# Patient Record
Sex: Male | Born: 1965
Health system: Southern US, Community
[De-identification: ages and names within clinical notes are randomized; demographics above are authoritative.]

## PROBLEM LIST (undated history)

## (undated) DIAGNOSIS — L732 Hidradenitis suppurativa: Secondary | ICD-10-CM

## (undated) DIAGNOSIS — H5440 Blindness, one eye, unspecified eye: Secondary | ICD-10-CM

## (undated) DIAGNOSIS — F1910 Other psychoactive substance abuse, uncomplicated: Secondary | ICD-10-CM

## (undated) HISTORY — DX: Blindness, one eye, unspecified eye: H54.40

## (undated) HISTORY — DX: Hidradenitis suppurativa: L73.2

## (undated) HISTORY — PX: BACK SURGERY: SHX140

## (undated) HISTORY — DX: Other psychoactive substance abuse, uncomplicated: F19.10

---

## 1999-12-24 ENCOUNTER — Emergency Department (HOSPITAL_COMMUNITY): Admission: EM | Admit: 1999-12-24 | Discharge: 1999-12-24 | Payer: Self-pay | Admitting: Emergency Medicine

## 1999-12-24 ENCOUNTER — Encounter: Payer: Self-pay | Admitting: Emergency Medicine

## 2000-06-27 ENCOUNTER — Emergency Department (HOSPITAL_COMMUNITY): Admission: EM | Admit: 2000-06-27 | Discharge: 2000-06-27 | Payer: Self-pay | Admitting: *Deleted

## 2000-06-27 ENCOUNTER — Encounter: Payer: Self-pay | Admitting: Emergency Medicine

## 2001-09-14 ENCOUNTER — Emergency Department (HOSPITAL_COMMUNITY): Admission: EM | Admit: 2001-09-14 | Discharge: 2001-09-14 | Payer: Self-pay | Admitting: Emergency Medicine

## 2001-12-11 ENCOUNTER — Emergency Department (HOSPITAL_COMMUNITY): Admission: EM | Admit: 2001-12-11 | Discharge: 2001-12-11 | Payer: Self-pay | Admitting: Emergency Medicine

## 2003-03-31 ENCOUNTER — Emergency Department (HOSPITAL_COMMUNITY): Admission: EM | Admit: 2003-03-31 | Discharge: 2003-03-31 | Payer: Self-pay | Admitting: Emergency Medicine

## 2003-07-15 ENCOUNTER — Emergency Department (HOSPITAL_COMMUNITY): Admission: EM | Admit: 2003-07-15 | Discharge: 2003-07-15 | Payer: Self-pay

## 2004-09-23 ENCOUNTER — Emergency Department (HOSPITAL_COMMUNITY): Admission: EM | Admit: 2004-09-23 | Discharge: 2004-09-23 | Payer: Self-pay | Admitting: Emergency Medicine

## 2004-10-22 ENCOUNTER — Encounter: Admission: RE | Admit: 2004-10-22 | Discharge: 2005-01-20 | Payer: Self-pay | Admitting: Occupational Medicine

## 2005-02-25 ENCOUNTER — Ambulatory Visit (HOSPITAL_COMMUNITY): Admission: RE | Admit: 2005-02-25 | Discharge: 2005-02-26 | Payer: Self-pay | Admitting: Neurosurgery

## 2005-02-25 DIAGNOSIS — M5126 Other intervertebral disc displacement, lumbar region: Secondary | ICD-10-CM

## 2005-04-26 ENCOUNTER — Encounter: Admission: RE | Admit: 2005-04-26 | Discharge: 2005-07-25 | Payer: Self-pay | Admitting: Neurosurgery

## 2005-12-20 ENCOUNTER — Ambulatory Visit: Payer: Self-pay | Admitting: Internal Medicine

## 2005-12-20 DIAGNOSIS — IMO0002 Reserved for concepts with insufficient information to code with codable children: Secondary | ICD-10-CM | POA: Insufficient documentation

## 2006-01-04 ENCOUNTER — Ambulatory Visit: Payer: Self-pay | Admitting: Internal Medicine

## 2006-01-04 ENCOUNTER — Ambulatory Visit: Payer: Self-pay | Admitting: *Deleted

## 2006-12-07 ENCOUNTER — Encounter (INDEPENDENT_AMBULATORY_CARE_PROVIDER_SITE_OTHER): Payer: Self-pay | Admitting: *Deleted

## 2007-02-23 ENCOUNTER — Emergency Department (HOSPITAL_COMMUNITY): Admission: EM | Admit: 2007-02-23 | Discharge: 2007-02-23 | Payer: Self-pay | Admitting: Emergency Medicine

## 2007-05-04 ENCOUNTER — Ambulatory Visit: Payer: Self-pay | Admitting: Internal Medicine

## 2007-05-15 ENCOUNTER — Telehealth (INDEPENDENT_AMBULATORY_CARE_PROVIDER_SITE_OTHER): Payer: Self-pay | Admitting: Internal Medicine

## 2007-05-31 ENCOUNTER — Telehealth (INDEPENDENT_AMBULATORY_CARE_PROVIDER_SITE_OTHER): Payer: Self-pay | Admitting: Internal Medicine

## 2007-06-19 ENCOUNTER — Encounter (INDEPENDENT_AMBULATORY_CARE_PROVIDER_SITE_OTHER): Payer: Self-pay | Admitting: Internal Medicine

## 2007-06-20 ENCOUNTER — Ambulatory Visit: Payer: Self-pay | Admitting: Internal Medicine

## 2007-06-22 ENCOUNTER — Encounter (INDEPENDENT_AMBULATORY_CARE_PROVIDER_SITE_OTHER): Payer: Self-pay | Admitting: Internal Medicine

## 2007-07-17 ENCOUNTER — Telehealth (INDEPENDENT_AMBULATORY_CARE_PROVIDER_SITE_OTHER): Payer: Self-pay | Admitting: Internal Medicine

## 2007-08-18 ENCOUNTER — Encounter (INDEPENDENT_AMBULATORY_CARE_PROVIDER_SITE_OTHER): Payer: Self-pay | Admitting: Internal Medicine

## 2007-08-21 ENCOUNTER — Telehealth (INDEPENDENT_AMBULATORY_CARE_PROVIDER_SITE_OTHER): Payer: Self-pay | Admitting: Internal Medicine

## 2007-09-12 ENCOUNTER — Encounter (INDEPENDENT_AMBULATORY_CARE_PROVIDER_SITE_OTHER): Payer: Self-pay | Admitting: Internal Medicine

## 2007-09-21 ENCOUNTER — Ambulatory Visit: Payer: Self-pay | Admitting: Internal Medicine

## 2007-09-21 DIAGNOSIS — G4700 Insomnia, unspecified: Secondary | ICD-10-CM

## 2007-09-21 DIAGNOSIS — F3289 Other specified depressive episodes: Secondary | ICD-10-CM | POA: Insufficient documentation

## 2007-09-21 DIAGNOSIS — F329 Major depressive disorder, single episode, unspecified: Secondary | ICD-10-CM

## 2007-10-20 ENCOUNTER — Telehealth (INDEPENDENT_AMBULATORY_CARE_PROVIDER_SITE_OTHER): Payer: Self-pay | Admitting: Internal Medicine

## 2007-11-23 ENCOUNTER — Telehealth (INDEPENDENT_AMBULATORY_CARE_PROVIDER_SITE_OTHER): Payer: Self-pay | Admitting: Internal Medicine

## 2007-12-01 ENCOUNTER — Ambulatory Visit: Payer: Self-pay | Admitting: Internal Medicine

## 2007-12-21 ENCOUNTER — Encounter (INDEPENDENT_AMBULATORY_CARE_PROVIDER_SITE_OTHER): Payer: Self-pay | Admitting: Internal Medicine

## 2007-12-21 ENCOUNTER — Telehealth (INDEPENDENT_AMBULATORY_CARE_PROVIDER_SITE_OTHER): Payer: Self-pay | Admitting: Internal Medicine

## 2007-12-21 ENCOUNTER — Encounter (INDEPENDENT_AMBULATORY_CARE_PROVIDER_SITE_OTHER): Payer: Self-pay | Admitting: Nurse Practitioner

## 2008-01-18 ENCOUNTER — Ambulatory Visit: Payer: Self-pay | Admitting: Internal Medicine

## 2008-01-18 DIAGNOSIS — K089 Disorder of teeth and supporting structures, unspecified: Secondary | ICD-10-CM | POA: Insufficient documentation

## 2008-02-19 ENCOUNTER — Telehealth (INDEPENDENT_AMBULATORY_CARE_PROVIDER_SITE_OTHER): Payer: Self-pay | Admitting: Internal Medicine

## 2008-03-20 ENCOUNTER — Telehealth (INDEPENDENT_AMBULATORY_CARE_PROVIDER_SITE_OTHER): Payer: Self-pay | Admitting: Internal Medicine

## 2008-04-02 ENCOUNTER — Ambulatory Visit: Payer: Self-pay | Admitting: Internal Medicine

## 2008-04-02 ENCOUNTER — Telehealth (INDEPENDENT_AMBULATORY_CARE_PROVIDER_SITE_OTHER): Payer: Self-pay | Admitting: Internal Medicine

## 2008-04-05 ENCOUNTER — Encounter (INDEPENDENT_AMBULATORY_CARE_PROVIDER_SITE_OTHER): Payer: Self-pay | Admitting: Internal Medicine

## 2008-04-09 ENCOUNTER — Telehealth (INDEPENDENT_AMBULATORY_CARE_PROVIDER_SITE_OTHER): Payer: Self-pay | Admitting: Internal Medicine

## 2008-04-16 ENCOUNTER — Telehealth (INDEPENDENT_AMBULATORY_CARE_PROVIDER_SITE_OTHER): Payer: Self-pay | Admitting: *Deleted

## 2008-05-02 ENCOUNTER — Telehealth (INDEPENDENT_AMBULATORY_CARE_PROVIDER_SITE_OTHER): Payer: Self-pay | Admitting: *Deleted

## 2008-05-20 ENCOUNTER — Telehealth (INDEPENDENT_AMBULATORY_CARE_PROVIDER_SITE_OTHER): Payer: Self-pay | Admitting: Internal Medicine

## 2008-05-29 ENCOUNTER — Telehealth (INDEPENDENT_AMBULATORY_CARE_PROVIDER_SITE_OTHER): Payer: Self-pay | Admitting: Internal Medicine

## 2008-06-19 ENCOUNTER — Telehealth (INDEPENDENT_AMBULATORY_CARE_PROVIDER_SITE_OTHER): Payer: Self-pay | Admitting: Internal Medicine

## 2008-07-01 ENCOUNTER — Telehealth (INDEPENDENT_AMBULATORY_CARE_PROVIDER_SITE_OTHER): Payer: Self-pay | Admitting: *Deleted

## 2008-07-19 ENCOUNTER — Telehealth (INDEPENDENT_AMBULATORY_CARE_PROVIDER_SITE_OTHER): Payer: Self-pay | Admitting: Internal Medicine

## 2008-07-30 ENCOUNTER — Telehealth (INDEPENDENT_AMBULATORY_CARE_PROVIDER_SITE_OTHER): Payer: Self-pay | Admitting: Internal Medicine

## 2008-08-08 ENCOUNTER — Ambulatory Visit: Payer: Self-pay | Admitting: Internal Medicine

## 2008-08-20 ENCOUNTER — Telehealth (INDEPENDENT_AMBULATORY_CARE_PROVIDER_SITE_OTHER): Payer: Self-pay | Admitting: Internal Medicine

## 2008-08-30 ENCOUNTER — Telehealth (INDEPENDENT_AMBULATORY_CARE_PROVIDER_SITE_OTHER): Payer: Self-pay | Admitting: Internal Medicine

## 2008-09-18 ENCOUNTER — Telehealth (INDEPENDENT_AMBULATORY_CARE_PROVIDER_SITE_OTHER): Payer: Self-pay | Admitting: *Deleted

## 2008-09-30 ENCOUNTER — Telehealth (INDEPENDENT_AMBULATORY_CARE_PROVIDER_SITE_OTHER): Payer: Self-pay | Admitting: *Deleted

## 2008-10-22 ENCOUNTER — Ambulatory Visit: Payer: Self-pay | Admitting: Internal Medicine

## 2008-10-31 ENCOUNTER — Telehealth (INDEPENDENT_AMBULATORY_CARE_PROVIDER_SITE_OTHER): Payer: Self-pay | Admitting: Internal Medicine

## 2008-11-21 ENCOUNTER — Telehealth (INDEPENDENT_AMBULATORY_CARE_PROVIDER_SITE_OTHER): Payer: Self-pay | Admitting: Internal Medicine

## 2008-12-02 ENCOUNTER — Telehealth (INDEPENDENT_AMBULATORY_CARE_PROVIDER_SITE_OTHER): Payer: Self-pay | Admitting: Internal Medicine

## 2008-12-24 ENCOUNTER — Ambulatory Visit: Payer: Self-pay | Admitting: Internal Medicine

## 2008-12-31 ENCOUNTER — Telehealth (INDEPENDENT_AMBULATORY_CARE_PROVIDER_SITE_OTHER): Payer: Self-pay | Admitting: Internal Medicine

## 2009-01-20 ENCOUNTER — Telehealth (INDEPENDENT_AMBULATORY_CARE_PROVIDER_SITE_OTHER): Payer: Self-pay | Admitting: *Deleted

## 2009-01-30 ENCOUNTER — Telehealth (INDEPENDENT_AMBULATORY_CARE_PROVIDER_SITE_OTHER): Payer: Self-pay | Admitting: Internal Medicine

## 2009-02-18 ENCOUNTER — Telehealth (INDEPENDENT_AMBULATORY_CARE_PROVIDER_SITE_OTHER): Payer: Self-pay | Admitting: Internal Medicine

## 2009-03-03 ENCOUNTER — Telehealth (INDEPENDENT_AMBULATORY_CARE_PROVIDER_SITE_OTHER): Payer: Self-pay | Admitting: *Deleted

## 2009-03-24 ENCOUNTER — Telehealth (INDEPENDENT_AMBULATORY_CARE_PROVIDER_SITE_OTHER): Payer: Self-pay | Admitting: Internal Medicine

## 2009-04-02 ENCOUNTER — Telehealth (INDEPENDENT_AMBULATORY_CARE_PROVIDER_SITE_OTHER): Payer: Self-pay | Admitting: Internal Medicine

## 2009-04-23 ENCOUNTER — Telehealth (INDEPENDENT_AMBULATORY_CARE_PROVIDER_SITE_OTHER): Payer: Self-pay | Admitting: Internal Medicine

## 2009-05-02 ENCOUNTER — Telehealth (INDEPENDENT_AMBULATORY_CARE_PROVIDER_SITE_OTHER): Payer: Self-pay | Admitting: Internal Medicine

## 2009-05-19 ENCOUNTER — Telehealth (INDEPENDENT_AMBULATORY_CARE_PROVIDER_SITE_OTHER): Payer: Self-pay | Admitting: Internal Medicine

## 2009-06-02 ENCOUNTER — Telehealth (INDEPENDENT_AMBULATORY_CARE_PROVIDER_SITE_OTHER): Payer: Self-pay | Admitting: Internal Medicine

## 2009-06-20 ENCOUNTER — Telehealth (INDEPENDENT_AMBULATORY_CARE_PROVIDER_SITE_OTHER): Payer: Self-pay | Admitting: Internal Medicine

## 2009-07-07 ENCOUNTER — Telehealth (INDEPENDENT_AMBULATORY_CARE_PROVIDER_SITE_OTHER): Payer: Self-pay | Admitting: Internal Medicine

## 2009-07-21 ENCOUNTER — Telehealth (INDEPENDENT_AMBULATORY_CARE_PROVIDER_SITE_OTHER): Payer: Self-pay | Admitting: Internal Medicine

## 2009-07-30 ENCOUNTER — Telehealth (INDEPENDENT_AMBULATORY_CARE_PROVIDER_SITE_OTHER): Payer: Self-pay | Admitting: Internal Medicine

## 2009-08-25 ENCOUNTER — Telehealth (INDEPENDENT_AMBULATORY_CARE_PROVIDER_SITE_OTHER): Payer: Self-pay | Admitting: Internal Medicine

## 2009-09-15 ENCOUNTER — Telehealth (INDEPENDENT_AMBULATORY_CARE_PROVIDER_SITE_OTHER): Payer: Self-pay | Admitting: Internal Medicine

## 2009-09-23 ENCOUNTER — Telehealth (INDEPENDENT_AMBULATORY_CARE_PROVIDER_SITE_OTHER): Payer: Self-pay | Admitting: Internal Medicine

## 2009-10-07 ENCOUNTER — Telehealth (INDEPENDENT_AMBULATORY_CARE_PROVIDER_SITE_OTHER): Payer: Self-pay | Admitting: Internal Medicine

## 2009-11-04 ENCOUNTER — Telehealth (INDEPENDENT_AMBULATORY_CARE_PROVIDER_SITE_OTHER): Payer: Self-pay | Admitting: *Deleted

## 2009-11-07 ENCOUNTER — Encounter (INDEPENDENT_AMBULATORY_CARE_PROVIDER_SITE_OTHER): Payer: Self-pay | Admitting: Internal Medicine

## 2010-04-21 NOTE — Progress Notes (Signed)
Summary: CALLING FOR PERCOCET SCRIPT   Phone Note Call from Patient Call back at Home Phone [phone]   Reason for Call: Refill Medication Summary of Call: [name] PT. MR [name] FOR HIS PERCOCET RX. AND WANTS TO KNOW IF HE CAN PICK IT UP ON THIS FRIDAY WHEN HE COMES IN FOR HIS BLOOD WORK. Initial call taken by: Leodis Rains,  April 23, 2009 11:44 AM  Follow-up for Phone Call        Last got #90 on 03/26/09 Follow-up by: Vesta Mixer CMA,  April 23, 2009 12:29 PM  Additional Follow-up for Phone Call Additional follow up Details #1::        Rx up front for pt to pick up tomorrow. Additional Follow-up by: Vesta Mixer CMA,  April 24, 2009 10:24 AM    Prescriptions: PERCOCET 5-325 MG  TABS (OXYCODONE-ACETAMINOPHEN) 1-2 tabs by mouth q4-6 h as needed pain  #90 x 0   Entered and Authorized by:   Julieanne Manson MD   Signed by:   Julieanne Manson MD on 04/23/2009   Method used:   Print then Give to Patient   RxID:   [id number]  May pick up on Friday

## 2010-04-21 NOTE — Progress Notes (Signed)
Summary: Refills   Phone Note Call from Patient Call back at [facility] Phone [phone]   Summary of Call: The pt needs refills from percocet which due on Monday June 23, 2009. [facility] MD Initial call taken by: Manon Hilding,  June 20, 2009 10:53 AM  Follow-up for Phone Call        Last got #90 on 05/22/09. Follow-up by: Vesta Mixer CMA,  June 20, 2009 11:18 AM  Additional Follow-up for Phone Call Additional follow up Details #1::        PLEASE GIVE HIM A CALL WHEN IT'S READY Additional Follow-up by: Arta Bruce,  June 23, 2009 8:39 AM    Additional Follow-up for Phone Call Additional follow up Details #2::    Call--can pick up tomorrow morning. Follow-up by: Julieanne Manson MD,  June 23, 2009 9:47 AM  Prescriptions: PERCOCET 5-325 MG  TABS (OXYCODONE-ACETAMINOPHEN) 1-2 tabs by mouth q4-6 h as needed pain  #90 x 0   Entered and Authorized by:   Julieanne Manson MD   Signed by:   Julieanne Manson MD on 06/23/2009   Method used:   Print then Give to Patient   RxID:   [id number]

## 2010-04-21 NOTE — Progress Notes (Signed)
Summary: morphene refills   Phone Note Call from Patient Call back at [facility] Phone [phone]   Summary of Call: The pt needs more refills from morphine. Pt could aford to pay the whole prescription instead of getting 60 pills he only got 48 pills.  Please call him back when is ready. Initial call taken by: Manon Hilding,  May 02, 2009 10:37 AM  Follow-up for Phone Call        By my calulations pt should need his refill on 05/05/09.  Last got on 04/11/09 and could not afford 6 days worth and January had 31 days. Follow-up by: Vesta Mixer CMA,  May 02, 2009 11:40 AM  Additional Follow-up for Phone Call Additional follow up Details #1::        May pick up on Monday Additional Follow-up by: Julieanne Manson MD,  May 02, 2009 5:33 PM    Additional Follow-up for Phone Call Additional follow up Details #2::    Pt picked up rx. Follow-up by: Vesta Mixer CMA,  May 05, 2009 9:29 AM  Prescriptions: MS CONTIN 30 MG XR12H-TAB (MORPHINE SULFATE) 1 tab by mouth q 12 hours  #60 x 0   Entered and Authorized by:   Julieanne Manson MD   Signed by:   Julieanne Manson MD on 05/02/2009   Method used:   Print then Give to Patient   RxID:   [id number]

## 2010-04-21 NOTE — Progress Notes (Signed)
Summary: Possible Discharge   Phone Note Outgoing Call   Summary of Call: Dr Delrae Alfred, This is patients 4th no-show. Pt has be counseled on no-shows in the past by staff member. Please review chart prior to discharge from practice for health stability. Initial call taken by: Hassell Halim CMA,  November 04, 2009 3:36 PM  Follow-up for Phone Call        Send letter. Follow-up by: Julieanne Manson MD,  November 05, 2009 9:49 PM  Additional Follow-up for Phone Call Additional follow up Details #1::        Chart reviewed by provider ok to send d/c letter. Additional Follow-up by: Hassell Halim CMA,  November 06, 2009 4:53 PM    Additional Follow-up for Phone Call Additional follow up Details #2::    MAILED AND SCANNED DISCHARGE LETTER 11/07/09 Follow-up by: Arta Bruce,  November 07, 2009 9:32 AM

## 2010-04-21 NOTE — Progress Notes (Signed)
Summary: PERCOCET DU TODAY   Phone Note Call from Patient Call back at Home Phone [phone]   Reason for Call: Refill Medication Summary of Call: [facility] PT. MR Hogue CALLED TODAY FOR HIS PERCOCET, WHICH IS DUE TODAY. Initial call taken by: Leodis Rains,  August 25, 2009 9:24 AM  Follow-up for Phone Call        Pt last got #90 on 07/23/09. Follow-up by: Vesta Mixer CMA,  August 25, 2009 11:11 AM  Additional Follow-up for Phone Call Additional follow up Details #1::        He needs to follow up with me--last Rx before he is seen--was supposed to be seen in Feb Additional Follow-up by: Julieanne Manson MD,  August 26, 2009 4:51 PM    Additional Follow-up for Phone Call Additional follow up Details #2::    Kim to schedule f/u Follow-up by: Vesta Mixer CMA,  August 26, 2009 5:00 PM  Prescriptions: PERCOCET 5-325 MG  TABS (OXYCODONE-ACETAMINOPHEN) 1-2 tabs by mouth q4-6 h as needed pain  #90 x 0   Entered and Authorized by:   Julieanne Manson MD   Signed by:   Julieanne Manson MD on 08/26/2009   Method used:   Print then Give to Patient   RxID:   [id number]

## 2010-04-21 NOTE — Progress Notes (Signed)
Summary: MORPHINE DUE   Phone Note Call from Patient Call back at Home Phone [phone]   Reason for Call: Refill Medication Summary of Call: Britini Garcilazo PT. MR Folkes IS CALLING FOR HIS MORPHINE TO BE REFILLED. Initial call taken by: Leodis Rains,  September 15, 2009 9:09 AM  Follow-up for Phone Call        Last got #60 on 5/18 Follow-up by: Vesta Mixer CMA,  September 15, 2009 9:33 AM  Additional Follow-up for Phone Call Additional follow up Details #1::        Rx printed pt must come to pick up Additional Follow-up by: Lehman Prom FNP,  September 15, 2009 4:38 PM    Additional Follow-up for Phone Call Additional follow up Details #2::    Pt aware. Follow-up by: Vesta Mixer CMA,  September 15, 2009 4:42 PM  Prescriptions: MS CONTIN 30 MG XR12H-TAB (MORPHINE SULFATE) 1 tab by mouth q 12 hours  #60 x 0   Entered and Authorized by:   Lehman Prom FNP   Signed by:   Lehman Prom FNP on 09/15/2009   Method used:   Print then Give to Patient   RxID:   [id number]

## 2010-04-21 NOTE — Progress Notes (Signed)
Summary: PERCOCET RX   Phone Note Call from Patient Call back at Home Phone [phone]   Reason for Call: Refill Medication Summary of Call: Marielouise Amey PT. MR Delduca IS CALLING FOR HIS PERCOCET. WHICH IS DUE TOMORROW. MR Cary SAYS THAT THE PRICE OF HIS MORPHINE MEDICATION  WENT UP TO $70 A BOTTLE ANDHE CAN'T AFFORD TO GET ALL 60 AND HE ONLY GOT 35 OF THEM, AND HE HAS BEEN TRYING TO MAKE THEM LAST, AND THATS WHY HE HAS RAN OUT OF THE PERCOCET. Initial call taken by: Leodis Rains,  March 24, 2009 12:30 PM  Follow-up for Phone Call        Last got #90 on 02/21/09. Follow-up by: Vesta Mixer CMA,  March 24, 2009 3:32 PM    Prescriptions: PERCOCET 5-325 MG  TABS (OXYCODONE-ACETAMINOPHEN) 1-2 tabs by mouth q4-6 h as needed pain  #90 x 0   Entered and Authorized by:   Julieanne Manson MD   Signed by:   Julieanne Manson MD on 03/26/2009   Method used:   Print then Give to Patient   RxID:   [id number]

## 2010-04-21 NOTE — Progress Notes (Signed)
Summary: morphine refill   Phone Note Call from Patient   Summary of Call: The pt needs refills from his morphene mdication and he needs to pick up by Friday. Mieko Kneebone MD Initial call taken by: Manon Hilding,  Jul 30, 2009 11:26 AM  Follow-up for Phone Call        Pt last got #60 on 07/07/09. Follow-up by: Vesta Mixer CMA,  Jul 30, 2009 11:37 AM  Additional Follow-up for Phone Call Additional follow up Details #1::        Not due until the 18th Additional Follow-up by: Julieanne Manson MD,  Aug 01, 2009 7:42 AM    Prescriptions: MS CONTIN 30 MG XR12H-TAB (MORPHINE SULFATE) 1 tab by mouth q 12 hours  #60 x 0   Entered and Authorized by:   Julieanne Manson MD   Signed by:   Julieanne Manson MD on 08/06/2009   Method used:   Print then Give to Patient   RxID:   [id number]

## 2010-04-21 NOTE — Progress Notes (Signed)
Summary: percocet refill Wed.   Phone Note Call from Patient   Summary of Call: The pt needs refills from his percocet medication that due on Wednesday. Cheray Pardi MD Initial call taken by: Manon Hilding,  Jul 21, 2009 11:06 AM  Follow-up for Phone Call        Last got #90 on 06/23/09. Follow-up by: Vesta Mixer CMA,  Jul 21, 2009 11:44 AM  Additional Follow-up for Phone Call Additional follow up Details #1::        May pick up. Additional Follow-up by: Julieanne Manson MD,  Jul 23, 2009 8:20 AM    Prescriptions: PERCOCET 5-325 MG  TABS (OXYCODONE-ACETAMINOPHEN) 1-2 tabs by mouth q4-6 h as needed pain  #90 x 0   Entered and Authorized by:   Julieanne Manson MD   Signed by:   Julieanne Manson MD on 07/23/2009   Method used:   Print then Give to Patient   RxID:   [id number]

## 2010-04-21 NOTE — Progress Notes (Signed)
Summary: REQUEST FOR HIS MORPHENE RX   Phone Note Call from Patient Call back at Home Phone [phone]   Reason for Call: Refill Medication Summary of Call: James Wright PT. James Wright IS CALLING IN FOR HIS [facility] RX. Initial call taken by: Leodis Rains,  June 02, 2009 10:01 AM  Follow-up for Phone Call        Last got #60 on 05/02/09. Follow-up by: Vesta Mixer CMA,  June 02, 2009 11:07 AM  Additional Follow-up for Phone Call Additional follow up Details #1::        HAS A LAB VISIT 06/18/09 AND PE5 AON 06/20/09 HAS APPLIED FOR MEDICAID ,,WAITING FOR REAPPLY Additional Follow-up by: Arta Bruce,  June 05, 2009 9:20 AM    Prescriptions: MS CONTIN 30 MG XR12H-TAB (MORPHINE SULFATE) 1 tab by mouth q 12 hours  #60 x 0   Entered and Authorized by:   Julieanne Manson MD   Signed by:   Julieanne Manson MD on 06/06/2009   Method used:   Print then Give to Patient   RxID:   [id number] MS CONTIN 30 MG XR12H-TAB (MORPHINE SULFATE) 1 tab by mouth q 12 hours  #60 x 0   Entered and Authorized by:   Julieanne Manson MD   Signed by:   Julieanne Manson MD on 06/04/2009   Method used:   Print then Give to Patient   RxID:   [id number]  Pt. needs an OV in next 2 months--does he have Medicaid yet--please check and let me know. Did not print the 1st time--reprinted

## 2010-04-21 NOTE — Progress Notes (Signed)
Summary: PERCOCET DUE FRIDAY   Phone Note Call from Patient Call back at Home Phone [phone]   Reason for Call: Refill Medication Summary of Call: Espn Zeman PT. MR Celia IS CALLING AHEAD OF TIME FOR HIS PERCOCET THAT IS DUE ON FRIDAY THE 4th Initial call taken by: Leodis Rains,  May 19, 2009 9:08 AM  Follow-up for Phone Call        Last got #90 on 04/23/09. Follow-up by: Vesta Mixer CMA,  May 19, 2009 10:23 AM    Prescriptions: PERCOCET 5-325 MG  TABS (OXYCODONE-ACETAMINOPHEN) 1-2 tabs by mouth q4-6 h as needed pain  #90 x 0   Entered and Authorized by:   Julieanne Manson MD   Signed by:   Julieanne Manson MD on 05/22/2009   Method used:   Print then Give to Patient   RxID:   [id number]  May pick up on Friday

## 2010-04-21 NOTE — Letter (Signed)
Summary: Letter/DISCHARGE LETTER MAILED  Letter/DISCHARGE LETTER MAILED   Imported By: Arta Bruce 11/07/2009 09:27:41  _____________________________________________________________________  External Attachment:    Type:   Image     Comment:   External Document

## 2010-04-21 NOTE — Progress Notes (Signed)
Summary: narcotic refill request   Phone Note Call from Patient Call back at Home Phone [phone]   Summary of Call: pt calling stating that his refill on morphine is due today.   Initial call taken by: Mikey College CMA,  July 07, 2009 8:43 AM  Follow-up for Phone Call        Rx printed and at nurse station notify pt to come pick up Follow-up by: Lehman Prom FNP,  July 07, 2009 5:54 PM  Additional Follow-up for Phone Call Additional follow up Details #1::        Pt picked up rx. Additional Follow-up by: Vesta Mixer CMA,  July 08, 2009 9:51 AM    Prescriptions: MS CONTIN 30 MG XR12H-TAB (MORPHINE SULFATE) 1 tab by mouth q 12 hours  #60 x 0   Entered and Authorized by:   Lehman Prom FNP   Signed by:   Lehman Prom FNP on 07/07/2009   Method used:   Print then Give to Patient   RxID:   [id number] MS CONTIN 30 MG XR12H-TAB (MORPHINE SULFATE) 1 tab by mouth q 12 hours  #60 x 0   Entered and Authorized by:   Lehman Prom FNP   Signed by:   Lehman Prom FNP on 07/07/2009   Method used:   Print then Give to Patient   RxID:   [id number]

## 2010-04-21 NOTE — Progress Notes (Signed)
Summary: CALING FOR MORPHINE   Phone Note Call from Patient Call back at Home Phone [phone]   Reason for Call: Refill Medication Summary of Call: James Wright PT. James Wright IS CALLING FOR HIS MORPHINE. HE SAYS THAT IT IS DUE TOMORROW. HE WASNT ABLE TO GET ALL OF IT LAST MONTH, ONLY A PORTION OF IT. SO THAT IS WHY HE IS OUT OF THEM. Initial call taken by: Leodis Rains,  April 02, 2009 12:32 PM  Follow-up for Phone Call        Last got #35 on 03/06/09.  Normally he gets #60, but could not afford them all this time. Follow-up by: Vesta Mixer CMA,  April 02, 2009 12:58 PM  Additional Follow-up for Phone Call Additional follow up Details #1::        Rx for percocet 03/24/2009 #90 in system. Per PCP. Additional Follow-up by: Tereso Newcomer PA-C,  April 02, 2009 5:12 PM    Additional Follow-up for Phone Call Additional follow up Details #2::    He gets both each month Follow-up by: Vesta Mixer CMA,  April 03, 2009 10:32 AM  Additional Follow-up for Phone Call Additional follow up Details #3:: Details for Additional Follow-up Action Taken: Tiffany--the #35 you have documented above--was that info the pharmacy gave you or the patient?  Can the pharmacy not just let him fill the rest of the month's Rx if he did only get a half fill?  Julieanne Manson MD  April 06, 2009 11:03 PM   Info is from pharmacy and since it is a controlled substance pt can not pick up any remaining quantity, will need to be bring in new rx............ Tiffany McCoy CMA  April 08, 2009 2:11 PM   Prescriptions: MS CONTIN 30 MG XR12H-TAB (MORPHINE SULFATE) 1 tab by mouth q 12 hours  #60 x 0   Entered and Authorized by:   Julieanne Manson MD   Signed by:   Julieanne Manson MD on 04/11/2009   Method used:   Print then Give to Patient   RxID:   [id number]

## 2010-04-21 NOTE — Progress Notes (Signed)
Summary: MORPHINE DUE FRIDAY   Phone Note Call from Patient Call back at Home Phone [phone]   Reason for Call: Refill Medication Summary of Call: [name] PT. [name] CALLING IN FOR HIS MORPHINE ON THIS FRIDAY. Initial call taken by: Leodis Rains,  October 07, 2009 11:50 AM  Follow-up for Phone Call        Last filled on 09/15/09,so not due until next Wed. 10/15/09. Follow-up by: Vesta Mixer CMA,  October 07, 2009 12:09 PM  Additional Follow-up for Phone Call Additional follow up Details #1::        May pick up tomorrow Additional Follow-up by: Julieanne Manson MD,  October 14, 2009 8:53 AM    Additional Follow-up for Phone Call Additional follow up Details #2::    Left message on answer machine for pt. to return call. Gaylyn Cheers RN  October 14, 2009 11:07 AM    Prescriptions: MS CONTIN 30 MG XR12H-TAB (MORPHINE SULFATE) 1 tab by mouth q 12 hours  #60 x 0   Entered and Authorized by:   Julieanne Manson MD   Signed by:   Julieanne Manson MD on 10/14/2009   Method used:   Print then Give to Patient   RxID:   [id number]

## 2010-04-21 NOTE — Progress Notes (Signed)
Summary: REFILL ON PERCOCET   Phone Note Call from Patient Call back at Home Phone [phone]   Reason for Call: Refill Medication Summary of Call: Aryahna Spagna PT. MR Cornman CALLED ABOUT GETTING HIS RX FOR PERCOCET. Initial call taken by: Leodis Rains,  September 23, 2009 10:45 AM  Follow-up for Phone Call        Last got #90 on 08/26/09. Follow-up by: Vesta Mixer CMA,  September 23, 2009 4:25 PM  Additional Follow-up for Phone Call Additional follow up Details #1::        Has he been in to see Dr. Eduard Clos yet? Additional Follow-up by: Julieanne Manson MD,  September 23, 2009 5:40 PM    Additional Follow-up for Phone Call Additional follow up Details #2::    No. he still had not seen Dr Eduard Clos because his medicaid card won't start until August the 1st.[facility]  September 24, 2009 11:00 AM  MR Laural Benes SAYS THAT HE WAS GOING TO WAIT AND SEE WHAT Karen Kitchens HIS MEDICAID AND MEDICARE WANT START TILL AUGUST 1..Kimberly Tinnin  September 24, 2009 2:58 PM  Please let him know on August 1, he needs to let us know about his coverage and he will be referred.  Once he is seeing Dr. Eduard Clos, we need to start weaning the pain meds--he needs an appt. at that time with me if he does not have one already scheduled.  Julieanne Manson MD  September 25, 2009 3:41 PM    Prescriptions: PERCOCET 5-325 MG  TABS (OXYCODONE-ACETAMINOPHEN) 1-2 tabs by mouth q4-6 h as needed pain  #90 x 0   Entered and Authorized by:   Julieanne Manson MD   Signed by:   Julieanne Manson MD on 09/25/2009   Method used:   Print then Give to Patient   RxID:   [id number]

## 2010-08-07 NOTE — Op Note (Signed)
NAME:  James Wright, James Wright NO.:  [id number]   MEDICAL RECORD NO.:  [id number]          PATIENT TYPE:  OIB   LOCATION:  3035                         FACILITY:  [facility]   PHYSICIAN:  Clydene Fake, M.D.  DATE OF BIRTH:  1965/08/30   DATE OF PROCEDURE:  02/25/2005  DATE OF DISCHARGE:                                 OPERATIVE REPORT   PREOPERATIVE DIAGNOSIS:  Herniated nucleus pulposus right L4-L5.   POSTOPERATIVE DIAGNOSIS:  Herniated nucleus pulposus right L4-L5.   PROCEDURE:  Right L4-L5 hemilaminectomy and discectomy, microdissection with  the microscope.   SURGEON:  Clydene Fake, M.D.   ASSISTANT:  None.   ANESTHESIA:  General endotracheal anesthesia.   ESTIMATED BLOOD LOSS:  Minimal.   BLOOD REPLACED:  None.   DRAINS:  None.   COMPLICATIONS:  None.   INDICATIONS FOR PROCEDURE:  The patient is a 45 year old gentleman who has  had back and right leg pain and numbness, had some weakness on dorsiflexion  at 5-/5.  MRI was done showing spondylitic changes and disc protrusion to  the right side at L4-L5 causing compression of the right L5 root.  The  patient is brought in for discectomy.   PROCEDURE IN DETAIL:  The patient was brought to the operating room, general  anesthesia was induced, the patient was placed in a prone position with all  pressure points padded.  The patient was prepped and draped in a sterile  fashion.  The incision was injected with 10 mL of 1% lidocaine with  epinephrine.  A needle was placed in the interspace and x-rays were  obtained, this was a poor x-ray but looked like it was pointed at the 4-5  interspace.  An incision was made centered over where the needle was placed.  The incision was taken down to the fascia, hemostasis was obtained with  Bovie cauterization.  The fascia was incised and subperiosteal dissection  was done at the L4-L5 spinous process and lamina to the facets.  Self-  retaining retractor was placed.  A  marker was placed in the interspace and  another x-ray was obtained confirming our position at L4-L5.  The microscope  was brought in for microdissection at this point.  A high speed drill was  used to start a hemilaminectomy and this was completed with Kerrison  punches.  The ligamentum flavum was removed to decompress the central canal  and a foraminotomy over the L5 root was done.  We explored the dural space  and found a large subligamentous disc protrusion at 4-5.  The disc space was  incised and discectomy performed with pituitary rongeurs and curets.  Hemostasis was obtained with Gelfoam and thrombin and bipolar cauterization.  When we were done, we had good central and lateral decompression and the L4  and L5 roots were decompressed throughout their foramen well and the central  canal was decompressed.  The wound was irrigated with antibiotic solution  and we had good hemostasis.  The retractors were removed and the fascia was  closed with 0 Vicryl interrupted suture.  The subcutaneous tissue was closed  with 0, 2-0, and 3-0 Vicryl interrupted sutures.  The skin was closed with  Benzoin and Steri-Strips.  A dressing was placed.  The patient was placed  back in the supine position, awakened from anesthesia, and transferred to  the recovery room in stable condition.           ______________________________  Clydene Fake, M.D.     JRH/MEDQ  D:  02/25/2005  T:  02/25/2005  Job:  [id number]

## 2012-02-21 ENCOUNTER — Emergency Department (INDEPENDENT_AMBULATORY_CARE_PROVIDER_SITE_OTHER)
Admission: EM | Admit: 2012-02-21 | Discharge: 2012-02-21 | Disposition: A | Discharge status: Home / Self Care | Payer: Medicare Other | Source: Home / Self Care

## 2012-02-21 ENCOUNTER — Encounter (HOSPITAL_COMMUNITY): Payer: Self-pay | Admitting: Emergency Medicine

## 2012-02-21 DIAGNOSIS — K0510 Chronic gingivitis, plaque induced: Secondary | ICD-10-CM

## 2012-02-21 DIAGNOSIS — K029 Dental caries, unspecified: Secondary | ICD-10-CM

## 2012-02-21 DIAGNOSIS — K0889 Other specified disorders of teeth and supporting structures: Secondary | ICD-10-CM

## 2012-02-21 DIAGNOSIS — K089 Disorder of teeth and supporting structures, unspecified: Secondary | ICD-10-CM

## 2012-02-21 MED ORDER — AMOXICILLIN 500 MG PO CAPS: 500.0000 mg | ORAL_CAPSULE | Freq: Three times a day (TID) | ORAL | Status: DC

## 2012-02-21 MED ORDER — NAPROXEN 500 MG PO TBEC: 500.0000 mg | DELAYED_RELEASE_TABLET | Freq: Two times a day (BID) | ORAL | Status: DC

## 2012-02-21 MED ORDER — HYDROCODONE-ACETAMINOPHEN 7.5-325 MG PO TABS: 1.0000 | ORAL_TABLET | ORAL | Status: DC | PRN

## 2012-02-21 NOTE — ED Provider Notes (Signed)
History     CSN: [id number]  Arrival date & time 02/21/12  1253   First MD Initiated Contact with Patient 02/21/12 1433      Chief Complaint  Patient presents with  . Dental Pain    (Consider location/radiation/quality/duration/timing/severity/associated sxs/prior treatment) HPI Comments: 46 year old male complains of a toothache 2 weeks. States she has been taking OTC meds and is followed some pills from his mother which helped. Been putting warm compresses to his face and nothing has abated the pain. He has not tried calling a Education officer, community. He states that the pain and tenderness is located in the left lower back teeth and in the jaw.   History reviewed. No pertinent past medical history.  Past Surgical History  Procedure Date  . Back surgery     No family history on file.  History  Substance Use Topics  . Smoking status: Current Every Day Smoker  . Smokeless tobacco: Not on file  . Alcohol Use: No      Review of Systems  All other systems reviewed and are negative.    Allergies  Review of patient's allergies indicates no known allergies.  Home Medications   Current Outpatient Rx  Name  Route  Sig  Dispense  Refill  . PERCOCET PO   Oral   Take by mouth.         . AMOXICILLIN 500 MG PO CAPS   Oral   Take 1 capsule (500 mg total) by mouth 3 (three) times daily.   21 capsule   0   . HYDROCODONE-ACETAMINOPHEN 7.5-325 MG PO TABS   Oral   Take 1 tablet by mouth every 4 (four) hours as needed for pain.   20 tablet   0   . NAPROXEN 500 MG PO TBEC   Oral   Take 1 tablet (500 mg total) by mouth 2 (two) times daily with a meal.   20 tablet   0     BP 126/81  Pulse 74  Temp 98 F (36.7 C) (Oral)  Resp 16  SpO2 99%  Physical Exam  Constitutional: He is oriented to person, place, and time. He appears well-developed and well-nourished. He appears distressed.  HENT:  Mouth/Throat: No oropharyngeal exudate.       Bilateral TMs are normal Oropharynx is  moist and clear Has generally poor dentition with several upper teeth missing Lower teeth with several apparent cavities. There is generalized gingivitis. The left lower second and third molars are tender. I do not see a discrete abscess formation.  Neck: Normal range of motion. Neck supple.  Pulmonary/Chest: Effort normal.  Neurological: He is alert and oriented to person, place, and time. He exhibits normal muscle tone.  Skin: Skin is warm and dry.  Psychiatric: He has a normal mood and affect.    ED Course  Procedures (including critical care time)  Labs Reviewed - No data to display No results found.   1. Toothache   2. Simple dental caries   3. Gingivitis, chronic       MDM  Naprosyn EC 500 mg twice a day with food when necessary pain Norco 7.5 one every 4 hours when necessary pain #20 Amoxicillin 500 mg 3 times a day for 7 days Have nurses to offer  a list of dentists that take Medicaid; other resources can be obtained by using Google to search for  dentists who take Medicaid in [facility],  NP 02/21/12 1453

## 2012-02-21 NOTE — ED Notes (Signed)
Toothache and jaw pain for approximately 10 days .  Patient reports on information form that he is transitioning to a new pcp, has used percocet and salt rinses, heating pad

## 2012-02-22 NOTE — ED Provider Notes (Signed)
Medical screening examination/treatment/procedure(s) were performed by non-physician practitioner and as supervising physician I was immediately available for consultation/collaboration.  Leslee Home, M.D.   Reuben Likes, MD 02/22/12 [phone]

## 2012-12-07 ENCOUNTER — Encounter (HOSPITAL_COMMUNITY): Payer: Self-pay | Admitting: *Deleted

## 2012-12-07 ENCOUNTER — Telehealth (HOSPITAL_COMMUNITY): Payer: Self-pay | Admitting: Emergency Medicine

## 2012-12-07 ENCOUNTER — Emergency Department (HOSPITAL_COMMUNITY)
Admission: EM | Admit: 2012-12-07 | Discharge: 2012-12-07 | Disposition: A | Discharge status: Home / Self Care | Payer: Medicare Other | Attending: Emergency Medicine | Admitting: Emergency Medicine

## 2012-12-07 DIAGNOSIS — L0201 Cutaneous abscess of face: Secondary | ICD-10-CM | POA: Insufficient documentation

## 2012-12-07 DIAGNOSIS — L03211 Cellulitis of face: Secondary | ICD-10-CM | POA: Insufficient documentation

## 2012-12-07 DIAGNOSIS — F17200 Nicotine dependence, unspecified, uncomplicated: Secondary | ICD-10-CM | POA: Insufficient documentation

## 2012-12-07 LAB — CBC WITH DIFFERENTIAL/PLATELET
Basophils Absolute: 0.1 10*3/uL (ref 0.0–0.1)
Eosinophils Absolute: 0.5 10*3/uL (ref 0.0–0.7)
Eosinophils Relative: 4 % (ref 0–5)
Lymphocytes Relative: 22 % (ref 12–46)
MCHC: 34.1 g/dL (ref 30.0–36.0)
Monocytes Relative: 9 % (ref 3–12)
Platelets: 280 10*3/uL (ref 150–400)
RBC: 5.45 MIL/uL (ref 4.22–5.81)
RDW: 14.3 % (ref 11.5–15.5)
WBC: 13.6 10*3/uL — ABNORMAL HIGH (ref 4.0–10.5)

## 2012-12-07 LAB — BASIC METABOLIC PANEL
BUN: 9 mg/dL (ref 6–23)
CO2: 27 mEq/L (ref 19–32)
Calcium: 9.3 mg/dL (ref 8.4–10.5)
Chloride: 105 mEq/L (ref 96–112)
GFR calc Af Amer: >90 mL/min (ref >90)
GFR calc non Af Amer: >90 mL/min (ref >90)
Glucose, Bld: 126 mg/dL — ABNORMAL HIGH (ref 70–99)
Potassium: 4.2 mEq/L (ref 3.5–5.1)

## 2012-12-07 MED ORDER — OXYCODONE-ACETAMINOPHEN 5-325 MG PO TABS: ORAL_TABLET | ORAL | Status: DC

## 2012-12-07 MED ORDER — SULFAMETHOXAZOLE-TMP DS 800-160 MG PO TABS: 1.0000 | ORAL_TABLET | Freq: Two times a day (BID) | ORAL | Status: DC

## 2012-12-07 MED ORDER — CLINDAMYCIN HCL 150 MG PO CAPS
450.0000 mg | ORAL_CAPSULE | Freq: Once | ORAL | Status: AC
  Administered 2012-12-07: 450 mg via ORAL
  Filled 2012-12-07 (×2): qty 1

## 2012-12-07 MED ORDER — KETOROLAC TROMETHAMINE 30 MG/ML IJ SOLN
30.0000 mg | Freq: Once | INTRAMUSCULAR | Status: AC
  Administered 2012-12-07: 30 mg via INTRAVENOUS
  Filled 2012-12-07: qty 1

## 2012-12-07 MED ORDER — CLINDAMYCIN HCL 150 MG PO CAPS: 450.0000 mg | ORAL_CAPSULE | Freq: Three times a day (TID) | ORAL | Status: DC

## 2012-12-07 MED ORDER — OXYCODONE-ACETAMINOPHEN 5-325 MG PO TABS
1.0000 | ORAL_TABLET | Freq: Once | ORAL | Status: AC
  Administered 2012-12-07: 1 via ORAL
  Filled 2012-12-07: qty 1

## 2012-12-07 NOTE — ED Provider Notes (Signed)
CSN: [id number]     Arrival date & time 12/07/12  1008 History   First MD Initiated Contact with Patient 12/07/12 1119     Chief Complaint  Patient presents with  . Abscess   (Consider location/radiation/quality/duration/timing/severity/associated sxs/prior Treatment) HPI  James Wright is a 47 y.o. male who is otherwise healthy except for being an active daily smoker complaining of pain and swelling to forehead. Patient had a stinging to the forehead he noticed several days ago which she attributed to possible bug bite, yesterday the pain and swelling increased significantly there was a small amount of sero-sanguinous fluid. When he woke up today he noticed that the area was diffusely swollen down to the nasal bridge, patient denies any change in his vision, fever, nausea vomiting, pain with eye movement.   No past medical history on file. Past Surgical History  Procedure Laterality Date  . Back surgery     No family history on file. History  Substance Use Topics  . Smoking status: Current Every Day Smoker -- 1.00 packs/day    Types: Cigarettes  . Smokeless tobacco: Not on file  . Alcohol Use: No    Review of Systems 10 systems reviewed and found to be negative, except as noted in the HPI   Allergies  Review of patient's allergies indicates no known allergies.  Home Medications   Current Outpatient Rx  Name  Route  Sig  Dispense  Refill  . amoxicillin (AMOXIL) 500 MG capsule   Oral   Take 1 capsule (500 mg total) by mouth 3 (three) times daily.   21 capsule   0   . HYDROcodone-acetaminophen (NORCO) 7.5-325 MG per tablet   Oral   Take 1 tablet by mouth every 4 (four) hours as needed for pain.   20 tablet   0   . naproxen (EC-NAPROSYN) 500 MG EC tablet   Oral   Take 1 tablet (500 mg total) by mouth 2 (two) times daily with a meal.   20 tablet   0   . Oxycodone-Acetaminophen (PERCOCET PO)   Oral   Take by mouth.          BP 130/87  Pulse 100   Temp(Src) 98.5 F (36.9 C) (Oral)  Resp 16  Ht 6\' 2"  (1.88 m)  Wt 210 lb (95.255 kg)  BMI 26.95 kg/m2  SpO2 94% Physical Exam  Nursing note and vitals reviewed. Constitutional: He is oriented to person, place, and time. He appears well-developed and well-nourished. No distress.  HENT:  Head: Normocephalic.  Mouth/Throat: Oropharynx is clear and moist.  Eyes: Conjunctivae and EOM are normal. Pupils are equal, round, and reactive to light. Right eye exhibits no discharge. Left eye exhibits no discharge.  Patient endorses pain with left-sided superior lateral gaze.  Neck: Normal range of motion.  Cardiovascular: Normal rate, regular rhythm and intact distal pulses.   Pulmonary/Chest: Effort normal and breath sounds normal. No stridor. No respiratory distress. He has no wheezes. He has no rales. He exhibits no tenderness.  Abdominal: Soft. There is no tenderness.  Musculoskeletal: Normal range of motion.  Neurological: He is alert and oriented to person, place, and time.  Skin:  Patient has scabbed to central forehead with surrounding abscess approximately 1.5 cm in diameter there is diffuse soft tissue swelling on the forehead down into the nasal bridge in the medial bilateral eyes.  Psychiatric: He has a normal mood and affect.    ED Course  Procedures (including critical care time)  Labs Review Labs Reviewed  BASIC METABOLIC PANEL - Abnormal; Notable for the following:    Glucose, Bld 126 (*)    All other components within normal limits  CBC WITH DIFFERENTIAL - Abnormal; Notable for the following:    WBC 13.6 (*)    Neutro Abs 8.9 (*)    Monocytes Absolute 1.2 (*)    All other components within normal limits   Imaging Review No results found.  MDM   1. Abscess of face    Filed Vitals:   12/07/12 1016 12/07/12 1219 12/07/12 1230 12/07/12 1245  BP:  129/77 130/84 129/84  Pulse:  73 76 75  Temp:      TempSrc:      Resp:  18 20 18   Height:      Weight: 210 lb (95.255  kg)     SpO2:  95% 96% 96%     James Wright is a 46 y.o. male with abscess to forehead opened yesterday, nonfluctuant at this time. No systemic signs and symptoms. He does have a mild leukocytosis. Discussed case with attending who agrees with plan and stability to d/c to home.    Medications  clindamycin (CLEOCIN) capsule 450 mg (not administered)  oxyCODONE-acetaminophen (PERCOCET/ROXICET) 5-325 MG per tablet 1 tablet (1 tablet Oral Given 12/07/12 1138)  ketorolac (TORADOL) 30 MG/ML injection 30 mg (30 mg Intravenous Given 12/07/12 1241)    Pt is hemodynamically stable, appropriate for, and amenable to discharge at this time. Pt verbalized understanding and agrees with care plan. All questions answered. Outpatient follow-up and specific return precautions discussed.    New Prescriptions   CLINDAMYCIN (CLEOCIN) 150 MG CAPSULE    Take 3 capsules (450 mg total) by mouth 3 (three) times daily.   OXYCODONE-ACETAMINOPHEN (PERCOCET/ROXICET) 5-325 MG PER TABLET    1 to 2 tabs PO q6hrs  PRN for pain    Note: Portions of this report may have been transcribed using voice recognition software. Every effort was made to ensure accuracy; however, inadvertent computerized transcription errors may be present     Wynetta Emery, PA-C 12/07/12 1253

## 2012-12-07 NOTE — ED Notes (Signed)
Called pharmacy regarding delay in medication, states will send now.

## 2012-12-07 NOTE — ED Notes (Signed)
Antibiotic requested from pharmacy.

## 2012-12-07 NOTE — ED Notes (Signed)
MD at bedside. 

## 2012-12-07 NOTE — ED Notes (Signed)
Pt c/o photosensitivity at this time. EDP aware and states is OK for discharge after receiving antibiotics

## 2012-12-07 NOTE — ED Provider Notes (Signed)
Medical screening examination/treatment/procedure(s) were conducted as a shared visit with non-physician practitioner(s) and myself.  I personally evaluated the patient during the encounter.  Hx and PE c/w cellulitis/early abscess  Donnetta Hutching, MD 12/07/12 1932

## 2012-12-07 NOTE — ED Notes (Signed)
Pt comfortable with d/c and f/u instructions. Prescriptions x2. 

## 2012-12-07 NOTE — ED Notes (Signed)
Pt does not remember being bitten by an insect but states that several days ago, noticed stinging to forehead.  Last night felt pressure and expelled large amount of pus.  Today c/o a lot of pressure and pain to forehead where abscess is located.

## 2012-12-08 ENCOUNTER — Emergency Department (HOSPITAL_COMMUNITY)
Admission: EM | Admit: 2012-12-08 | Discharge: 2012-12-08 | Disposition: A | Discharge status: Home / Self Care | Payer: Medicare Other | Attending: Emergency Medicine | Admitting: Emergency Medicine

## 2012-12-08 ENCOUNTER — Emergency Department (HOSPITAL_COMMUNITY): Payer: Medicare Other

## 2012-12-08 ENCOUNTER — Encounter (HOSPITAL_COMMUNITY): Payer: Self-pay | Admitting: Emergency Medicine

## 2012-12-08 DIAGNOSIS — H05019 Cellulitis of unspecified orbit: Secondary | ICD-10-CM | POA: Insufficient documentation

## 2012-12-08 DIAGNOSIS — F17200 Nicotine dependence, unspecified, uncomplicated: Secondary | ICD-10-CM | POA: Insufficient documentation

## 2012-12-08 DIAGNOSIS — H00039 Abscess of eyelid unspecified eye, unspecified eyelid: Secondary | ICD-10-CM

## 2012-12-08 DIAGNOSIS — Z792 Long term (current) use of antibiotics: Secondary | ICD-10-CM | POA: Insufficient documentation

## 2012-12-08 DIAGNOSIS — H538 Other visual disturbances: Secondary | ICD-10-CM | POA: Insufficient documentation

## 2012-12-08 DIAGNOSIS — Z791 Long term (current) use of non-steroidal anti-inflammatories (NSAID): Secondary | ICD-10-CM | POA: Insufficient documentation

## 2012-12-08 LAB — CBC WITH DIFFERENTIAL/PLATELET
Eosinophils Absolute: 0.6 10*3/uL (ref 0.0–0.7)
HCT: 45.5 % (ref 39.0–52.0)
Lymphs Abs: 2.2 10*3/uL (ref 0.7–4.0)
MCH: 30.6 pg (ref 26.0–34.0)
MCHC: 34.9 g/dL (ref 30.0–36.0)
MCV: 87.5 fL (ref 78.0–100.0)
Monocytes Relative: 10 % (ref 3–12)
Platelets: 280 10*3/uL (ref 150–400)
RBC: 5.2 MIL/uL (ref 4.22–5.81)
RDW: 14.1 % (ref 11.5–15.5)
WBC: 11.2 10*3/uL — ABNORMAL HIGH (ref 4.0–10.5)

## 2012-12-08 LAB — BASIC METABOLIC PANEL
Calcium: 8.9 mg/dL (ref 8.4–10.5)
GFR calc Af Amer: >90 mL/min (ref >90)
GFR calc non Af Amer: >90 mL/min (ref >90)
Glucose, Bld: 143 mg/dL — ABNORMAL HIGH (ref 70–99)
Potassium: 4 mEq/L (ref 3.5–5.1)

## 2012-12-08 MED ORDER — MORPHINE SULFATE 4 MG/ML IJ SOLN
4.0000 mg | Freq: Once | INTRAMUSCULAR | Status: AC
  Administered 2012-12-08: 4 mg via INTRAVENOUS

## 2012-12-08 MED ORDER — MORPHINE SULFATE 4 MG/ML IJ SOLN
4.0000 mg | Freq: Once | INTRAMUSCULAR | Status: DC
  Filled 2012-12-08: qty 1

## 2012-12-08 MED ORDER — ONDANSETRON HCL 4 MG/2ML IJ SOLN
4.0000 mg | Freq: Once | INTRAMUSCULAR | Status: DC
  Filled 2012-12-08: qty 2

## 2012-12-08 MED ORDER — VANCOMYCIN HCL IN DEXTROSE 1-5 GM/200ML-% IV SOLN
1000.0000 mg | Freq: Three times a day (TID) | INTRAVENOUS | Status: DC
  Administered 2012-12-08: 1000 mg via INTRAVENOUS
  Filled 2012-12-08: qty 200

## 2012-12-08 MED ORDER — ONDANSETRON HCL 4 MG/2ML IJ SOLN
4.0000 mg | Freq: Once | INTRAMUSCULAR | Status: AC
  Administered 2012-12-08: 4 mg via INTRAVENOUS

## 2012-12-08 MED ORDER — SODIUM CHLORIDE 0.9 % IV BOLUS (SEPSIS)
1000.0000 mL | Freq: Once | INTRAVENOUS | Status: AC
  Administered 2012-12-08: 1000 mL via INTRAVENOUS

## 2012-12-08 MED ORDER — CLINDAMYCIN HCL 150 MG PO CAPS: 300.0000 mg | ORAL_CAPSULE | Freq: Three times a day (TID) | ORAL | Status: DC

## 2012-12-08 NOTE — ED Notes (Signed)
Patient states his swelling has moved lower and he wanted to get it checked.

## 2012-12-08 NOTE — ED Provider Notes (Signed)
Medical screening examination/treatment/procedure(s) were performed by non-physician practitioner and as supervising physician I was immediately available for consultation/collaboration.   Candyce Churn, MD 12/08/12 [phone]

## 2012-12-08 NOTE — ED Provider Notes (Signed)
CSN: [id number]     Arrival date & time 12/08/12  4098 History  This chart was scribed for non-physician practitioner, Roxy Horseman, PA-C ,working with Candyce Churn, MD, by Karle Plumber, ED Scribe.  This patient was seen in room TR04C/TR04C and the patient's care was started at 10:05 AM.  Chief Complaint  Patient presents with  . Facial Swelling   The history is provided by the patient. No language interpreter was used.   HPI Comments:  James Wright is a 47 y.o. male who presents to the Emergency Department complaining of sore, moderate facial swelling onset one day. Pt states he is unsure if he was bitten by an insect on his forehead. He reports some blurred vision in his right eye and reports not having any vision in his left due to an accident he experienced years prior. He presented to the ED yesterday with similar symptoms, but they have since worsened. He was prescribed Bactrim DS and has reported taking two doses since discharge yesterday. Pt reports h/o back surgery. He denies any other pertinent illnesses.  History reviewed. No pertinent past medical history. Past Surgical History  Procedure Laterality Date  . Back surgery     No family history on file. History  Substance Use Topics  . Smoking status: Current Every Day Smoker -- 1.00 packs/day    Types: Cigarettes  . Smokeless tobacco: Not on file  . Alcohol Use: No    Review of Systems  HENT: Positive for facial swelling.   Eyes: Negative for visual disturbance.  All other systems reviewed and are negative.   Allergies  Review of patient's allergies indicates no known allergies.  Home Medications   Current Outpatient Rx  Name  Route  Sig  Dispense  Refill  . amoxicillin (AMOXIL) 500 MG capsule   Oral   Take 1 capsule (500 mg total) by mouth 3 (three) times daily.   21 capsule   0   . HYDROcodone-acetaminophen (NORCO) 7.5-325 MG per tablet   Oral   Take 1 tablet by mouth every 4 (four) hours as  needed for pain.   20 tablet   0   . naproxen (EC-NAPROSYN) 500 MG EC tablet   Oral   Take 1 tablet (500 mg total) by mouth 2 (two) times daily with a meal.   20 tablet   0   . Oxycodone-Acetaminophen (PERCOCET PO)   Oral   Take by mouth.         . oxyCODONE-acetaminophen (PERCOCET/ROXICET) 5-325 MG per tablet      1 to 2 tabs PO q6hrs  PRN for pain   15 tablet   0   . sulfamethoxazole-trimethoprim (BACTRIM DS) 800-160 MG per tablet   Oral   Take 1 tablet by mouth 2 (two) times daily.   20 tablet   0    Triage Vitals: BP 131/83  Pulse 87  Temp(Src) 97.8 F (36.6 C) (Oral)  Resp 20  Ht 6\' 2"  (1.88 m)  Wt 212 lb (96.163 kg)  BMI 27.21 kg/m2  SpO2 99% Physical Exam  Nursing note and vitals reviewed. Constitutional: He is oriented to person, place, and time. He appears well-developed and well-nourished.  HENT:  Head: Normocephalic and atraumatic.  Nose: Nose normal.  Mouth/Throat: No oropharyngeal exudate.  Eyes: Conjunctivae and EOM are normal. Pupils are equal, round, and reactive to light. Right eye exhibits no discharge. Left eye exhibits no discharge. No scleral icterus.  Pain with extra occular eye movement.  Neck: Normal range of motion. Neck supple. No JVD present.  Cardiovascular: Normal rate, regular rhythm, normal heart sounds and intact distal pulses.  Exam reveals no gallop and no friction rub.   No murmur heard. Pulmonary/Chest: Effort normal and breath sounds normal. No respiratory distress. He has no wheezes. He has no rales. He exhibits no tenderness.  Abdominal: Soft. Bowel sounds are normal. He exhibits no distension and no mass. There is no tenderness. There is no rebound and no guarding.  Musculoskeletal: Normal range of motion. He exhibits no edema and no tenderness.  Neurological: He is alert and oriented to person, place, and time. He has normal reflexes.  CN 3-12 intact  Skin: Skin is warm and dry.  2x2 CM area of erythema in center of  forehead with mild surrounding cellulitis, also remarkable for mild to moderate bilateral periorbital cellulitis.  Psychiatric: He has a normal mood and affect. His behavior is normal. Judgment and thought content normal.    ED Course  Procedures (including critical care time) DIAGNOSTIC STUDIES: Oxygen Saturation is 99% on normal, normal by my interpretation.   COORDINATION OF CARE: 10:10 AM- Will obtain a CT scan and repeat lab work from yesterday. Will start IV fluids. Advised pt that IV antibiotics may be necessary. Discussed pt's case with Dr. Loretha Stapler and he will also see the pt. Pt verbalizes understanding and agrees to plan.  12:35 PM- Will prescribe Clindamycin.  Medications  vancomycin (VANCOCIN) IVPB 1000 mg/200 mL premix (1,000 mg Intravenous New Bag/Given 12/08/12 1123)  sodium chloride 0.9 % bolus 1,000 mL (1,000 mLs Intravenous New Bag/Given 12/08/12 1124)    Labs Review Labs Reviewed  CBC WITH DIFFERENTIAL - Abnormal; Notable for the following:    WBC 11.2 (*)    Monocytes Absolute 1.2 (*)    All other components within normal limits  BASIC METABOLIC PANEL - Abnormal; Notable for the following:    Glucose, Bld 143 (*)    All other components within normal limits   Imaging Review Ct Maxillofacial Wo Cm  12/08/2012   CLINICAL DATA:  Facial swelling for 1 day right eye blurred vision  EXAM: CT MAXILLOFACIAL WITHOUT CONTRAST  TECHNIQUE: Multidetector CT imaging of the maxillofacial structures was performed. Multiplanar CT image reconstructions were also generated. A small metallic BB was placed on the right temple in order to reliably differentiate right from left.  COMPARISON:  None.  FINDINGS: Study is limited without IV contrast. There is right nasal septum deviation. No nasal bone fracture is noted. Metallic dental at defects are noted. No facial fractures are noted.  Axial image 63 mild infraorbital and periorbital preseptal soft tissue swelling bilaterally with  symmetrical appearance. No facial fluid collection. No intraorbital hematoma. Mild perinasal soft tissue swelling.  Axial image 80 there is soft tissue swelling and diffuse subcutaneous stranding frontal scalp. There is skin thickening in axial image 93. Findings are highly suspicious for extensive cellulitis. Axial image 94 there is focal bulge of the skin with focal stranding of subcutaneous fat. This may represent primary focus of infection cellulitis. Focal injury or insect bite at this level cannot be excluded. Clinical correlation is necessary. No drainable fluid collection is noted.  Sagittal images shows patent nasopharyngeal and oropharyngeal airway. No prevertebral soft tissue swelling.  Coronal images shows no air-fluid levels. No intraorbital hematoma. Bilateral eye globes are symmetrical in appearance.  There is a small metallic foreign body within soft tissue left face anterior to the lateral aspect of maxillary sinus measures  about 4 mm this may be due to prior injury. Clinical correlation is necessary. No surrounding inflammatory changes are noted to suggest acute injury .  IMPRESSION: 1. No facial fractures are noted. 2. Axial image 63 mild infraorbital and periorbital preseptal soft tissue swelling bilaterally with symmetrical appearance. No facial fluid collection. No intraorbital hematoma. Mild perinasal soft tissue swelling.  Axial image 80 there is soft tissue swelling and diffuse subcutaneous stranding frontal scalp. There is skin thickening in axial image 93. Findings are highly suspicious for extensive cellulitis. Axial image 94 there is focal bulge of the skin with focal stranding of subcutaneous fat. This may represent primary focus of infection cellulitis. Focal injury or insect bite at this level cannot be excluded. Clinical correlation is necessary. No drainable fluid collection is noted.   Electronically Signed   By: Natasha Mead   On: 12/08/2012 11:28    MDM   1. Preseptal  cellulitis, unspecified laterality     Patient was seen yesterday for similar symptoms.  Symptoms have worsened despite antibiotics.  Pain with eye movement.  Patient only has one working eye.  Will get CT max, labs, and give a dose of vanc.  Patient seen by discussed with Dr. Loretha Stapler, who agrees with workup and treatment plan, and recommends switching to clindamycin.  The patient already has clindamycin at home.    I personally performed the services described in this documentation, which was scribed in my presence. The recorded information has been reviewed and is accurate.     Roxy Horseman, PA-C 12/08/12 1247

## 2012-12-08 NOTE — Progress Notes (Signed)
ANTIBIOTIC CONSULT NOTE - INITIAL  Pharmacy Consult for Vancomyin Indication: Periorbital cellulitis/abcess  No Known Allergies  Patient Measurements: Height: 6\' 2"  (188 cm) Weight: 212 lb (96.163 kg) IBW/kg (Calculated) : 82.2 Adjusted Body Weight:    Vital Signs: Temp: 97.8 F (36.6 C) (09/19 0935) Temp src: Oral (09/19 0935) BP: 131/83 mmHg (09/19 0935) Pulse Rate: 87 (09/19 0935) Intake/Output from previous day:   Intake/Output from this shift:    Labs:  Recent Labs  12/07/12 1146 12/08/12 1021  WBC 13.6* 11.2*  HGB 16.4 15.9  PLT 280 280  CREATININE 0.94  --    Estimated Creatinine Clearance: 115.4 ml/min (by C-G formula based on Cr of 0.94). No results found for this basename: VANCOTROUGH, VANCOPEAK, VANCORANDOM, GENTTROUGH, GENTPEAK, GENTRANDOM, TOBRATROUGH, TOBRAPEAK, TOBRARND, AMIKACINPEAK, AMIKACINTROU, AMIKACIN,  in the last 72 hours   Microbiology: No results found for this or any previous visit (from the past 720 hour(s)).  Medical History: History reviewed. No pertinent past medical history.  Medications: f/u med rec  Assessment: Noted stinging to forehead several days ago. ?bug bite? Last PM felt, forehead pressure and pus expelled. Came to ED 9/18. Today 9/19, woke up with periorbital swelling and blurry vision.  Afebrile. WBC 11.2. Scr 0.94 with estimated CrCl>100.  Goal of Therapy:  Vancomycin trough level 10-15 mcg/ml  Plan:  Vancomycin 1g IV q8hr Vanco trough after 3-5 doses at steady state.  Ciro Tashiro S. Merilynn Finland, PharmD, BCPS Clinical Staff Pharmacist Pager [phone]  Misty Stanley Stillinger 12/08/2012,10:57 AM

## 2012-12-08 NOTE — ED Notes (Signed)
Onset one day ago forehead swelling seen in ED given medication and antibiotic. States  Woke up today with periorbital swelling and blurry vision. Airway intact bilateral equal chest rise and fall

## 2012-12-08 NOTE — ED Notes (Signed)
IV unsuccessful x 2.  Will ask another RN to try.

## 2012-12-08 NOTE — ED Provider Notes (Signed)
Medical screening examination/treatment/procedure(s) were conducted as a shared visit with non-physician practitioner(s) and myself.  I personally evaluated the patient during the encounter  47 year old male with swelling and redness to his for head. Was evaluated yesterday and started on Bactrim. Redness has spread to below his bilateral eyes. No fevers or systemic symptoms. CT did not show evidence of orbital cellulitis, but did show pre-septal cellulitis. Will trial outpatient therapy with clindamycin. Strict return precautions given and she will followup in 2 days for recheck, because he has no outpatient followup.  Clinical Impression: 1. Preseptal cellulitis, unspecified laterality       Candyce Churn, MD 12/08/12 [id number]

## 2012-12-10 ENCOUNTER — Emergency Department (HOSPITAL_COMMUNITY)
Admission: EM | Admit: 2012-12-10 | Discharge: 2012-12-10 | Disposition: A | Discharge status: Home / Self Care | Payer: Medicare Other | Attending: Emergency Medicine | Admitting: Emergency Medicine

## 2012-12-10 ENCOUNTER — Encounter (HOSPITAL_COMMUNITY): Payer: Self-pay | Admitting: *Deleted

## 2012-12-10 DIAGNOSIS — F17200 Nicotine dependence, unspecified, uncomplicated: Secondary | ICD-10-CM | POA: Insufficient documentation

## 2012-12-10 DIAGNOSIS — Z792 Long term (current) use of antibiotics: Secondary | ICD-10-CM | POA: Insufficient documentation

## 2012-12-10 DIAGNOSIS — Z5189 Encounter for other specified aftercare: Secondary | ICD-10-CM

## 2012-12-10 DIAGNOSIS — Z4800 Encounter for change or removal of nonsurgical wound dressing: Secondary | ICD-10-CM | POA: Insufficient documentation

## 2012-12-10 DIAGNOSIS — H9209 Otalgia, unspecified ear: Secondary | ICD-10-CM | POA: Insufficient documentation

## 2012-12-10 MED ORDER — ACETAMINOPHEN 500 MG PO TABS: 500.0000 mg | ORAL_TABLET | Freq: Four times a day (QID) | ORAL | Status: DC | PRN

## 2012-12-10 MED ORDER — OXYCODONE-ACETAMINOPHEN 5-325 MG PO TABS: ORAL_TABLET | ORAL | Status: DC

## 2012-12-10 NOTE — ED Provider Notes (Signed)
Medical screening examination/treatment/procedure(s) were performed by non-physician practitioner and as supervising physician I was immediately available for consultation/collaboration.   Megan E Docherty, MD 12/10/12 1622 

## 2012-12-10 NOTE — ED Notes (Signed)
Pt here to have forehead rechecked and states that it drained Friday nite and patient did receive IV anbx on Friday.  Pt has been on clindamycin since Friday.  Symptoms better

## 2012-12-10 NOTE — ED Notes (Signed)
PA at bedside.

## 2012-12-10 NOTE — ED Provider Notes (Signed)
CSN: [id number]     Arrival date & time 12/10/12  1337 History  This chart was scribed for non-physician practitioner Junius Finner, PA-C, working with Shanna Cisco, MD by Dorothey Baseman, ED Scribe. This patient was seen in room TR09C/TR09C and the patient's care was started at 2:29 PM.    Chief Complaint  Patient presents with  . Wound Check   The history is provided by the patient. No language interpreter was used.   HPI Comments: James Wright is a 47 y.o. male who presents to the Emergency Department requesting a recheck of a wound to the forehead. He reports that he was seen here last Thursday and Friday, or 2 and 3 days ago, and received IV antibiotics and Clindamycin. He reports some associated drainage and a sharp, throbbing pain to the area that has been improving. He reports mild ear pain. He states that he has been using a heating pad and taking Percocet at home with relief. He denies fever, nausea, vomiting, or any other symptoms at this time. He denies history of abscesses.  History reviewed. No pertinent past medical history. Past Surgical History  Procedure Laterality Date  . Back surgery     No family history on file. History  Substance Use Topics  . Smoking status: Current Every Day Smoker -- 1.00 packs/day    Types: Cigarettes  . Smokeless tobacco: Not on file  . Alcohol Use: No    Review of Systems  Constitutional: Negative for fever.  HENT: Positive for ear pain.   Gastrointestinal: Negative for nausea and vomiting.  Skin: Positive for wound.  All other systems reviewed and are negative.    Allergies  Review of patient's allergies indicates no known allergies.  Home Medications   Current Outpatient Rx  Name  Route  Sig  Dispense  Refill  . acetaminophen (TYLENOL) 500 MG tablet   Oral   Take 1 tablet (500 mg total) by mouth every 6 (six) hours as needed for pain.   30 tablet   0   . clindamycin (CLEOCIN) 150 MG capsule   Oral   Take 2 capsules  (300 mg total) by mouth 3 (three) times daily. May dispense as 150mg  capsules   60 capsule   0   . oxyCODONE-acetaminophen (PERCOCET/ROXICET) 5-325 MG per tablet      Take 1-2 pills every 4-6 hours as needed for pain.   10 tablet   0   . sulfamethoxazole-trimethoprim (BACTRIM DS) 800-160 MG per tablet   Oral   Take 1 tablet by mouth 2 (two) times daily.   20 tablet   0    Triage Vitals: BP 129/82  Pulse 90  Temp(Src) 98.7 F (37.1 C) (Oral)  Resp 16  SpO2 96%  Physical Exam  Nursing note and vitals reviewed. Constitutional: He is oriented to person, place, and time. He appears well-developed and well-nourished.  HENT:  Head: Normocephalic and atraumatic.    Right Ear: Hearing, tympanic membrane, external ear and ear canal normal.  Left Ear: Hearing, tympanic membrane, external ear and ear canal normal.  Nose: Nose normal.  Mouth/Throat: Uvula is midline, oropharynx is clear and moist and mucous membranes are normal.  Eyes: EOM are normal.  Neck: Normal range of motion.  Cardiovascular: Normal rate, regular rhythm and normal heart sounds.   Pulmonary/Chest: Effort normal and breath sounds normal. No respiratory distress.  Musculoskeletal: Normal range of motion.  Neurological: He is alert and oriented to person, place, and time.  Skin: Skin is warm and dry. There is erythema.  3x4cm area of erythema and tenderness with centralized induration and opening of skin with scant serosanguinous discharge. TTP  Psychiatric: He has a normal mood and affect. His behavior is normal.    ED Course  Procedures (including critical care time)  DIAGNOSTIC STUDIES: Oxygen Saturation is 96% on room air, normal by my interpretation.    COORDINATION OF CARE: 2:38PM- Discussed that an incision and drainage will not be necessary today. Advised patient to continue the antibiotic course. Advised patient to continue using warm compresses. Will discharge patient with a refill for Perocet.  Discussed treatment plan with patient at bedside and patient verbalized agreement.    Labs Review Labs Reviewed - No data to display Imaging Review No results found.  MDM   1. Wound check, abscess    Wound on forehead is self-draining, mild TTP.  Does appear to be healing well.  No need for I&D at this time. Advised pt to use warm compresses several times per day to encourage further drainage and healing.  Advised to continue taking antibiotics as prescribed. All questions answered and concerns addressed. Will discharge pt home and have pt f/u with [facility] and [facility] info provided. Return precautions given. Pt verbalized understanding and agreement with tx plan. Vitals: unremarkable. Discharged in stable condition.    Discussed pt with attending during ED encounter and agrees with plan.   I personally performed the services described in this documentation, which was scribed in my presence. The recorded information has been reviewed and is accurate.    Junius Finner, PA-C 12/10/12 1543

## 2014-01-21 ENCOUNTER — Encounter (HOSPITAL_COMMUNITY): Payer: Self-pay | Admitting: Emergency Medicine

## 2014-01-21 ENCOUNTER — Emergency Department (HOSPITAL_COMMUNITY)
Admission: EM | Admit: 2014-01-21 | Discharge: 2014-01-21 | Disposition: A | Discharge status: Home / Self Care | Payer: Medicare Other | Attending: Emergency Medicine | Admitting: Emergency Medicine

## 2014-01-21 DIAGNOSIS — L03211 Cellulitis of face: Secondary | ICD-10-CM | POA: Insufficient documentation

## 2014-01-21 DIAGNOSIS — Z23 Encounter for immunization: Secondary | ICD-10-CM | POA: Diagnosis not present

## 2014-01-21 DIAGNOSIS — Z720 Tobacco use: Secondary | ICD-10-CM | POA: Diagnosis not present

## 2014-01-21 DIAGNOSIS — Z792 Long term (current) use of antibiotics: Secondary | ICD-10-CM | POA: Diagnosis not present

## 2014-01-21 DIAGNOSIS — L0201 Cutaneous abscess of face: Secondary | ICD-10-CM

## 2014-01-21 MED ORDER — OXYCODONE-ACETAMINOPHEN 5-325 MG PO TABS
1.0000 | ORAL_TABLET | Freq: Once | ORAL | Status: AC
Start: 2014-01-21 — End: 2014-01-21
  Administered 2014-01-21: 1 via ORAL
  Filled 2014-01-21: qty 1

## 2014-01-21 MED ORDER — TETANUS-DIPHTH-ACELL PERTUSSIS 5-2.5-18.5 LF-MCG/0.5 IM SUSP
0.5000 mL | Freq: Once | INTRAMUSCULAR | Status: AC
  Administered 2014-01-21: 0.5 mL via INTRAMUSCULAR
  Filled 2014-01-21: qty 0.5

## 2014-01-21 MED ORDER — LIDOCAINE-EPINEPHRINE (PF) 2 %-1:200000 IJ SOLN
10.0000 mL | Freq: Once | INTRAMUSCULAR | Status: AC
  Administered 2014-01-21: 10 mL via INTRADERMAL
  Filled 2014-01-21: qty 20

## 2014-01-21 MED ORDER — CLINDAMYCIN HCL 150 MG PO CAPS: 450.0000 mg | ORAL_CAPSULE | Freq: Four times a day (QID) | ORAL | Status: DC

## 2014-01-21 MED ORDER — CLINDAMYCIN HCL 150 MG PO CAPS
450.0000 mg | ORAL_CAPSULE | Freq: Once | ORAL | Status: AC
  Administered 2014-01-21: 450 mg via ORAL
  Filled 2014-01-21: qty 3

## 2014-01-21 MED ORDER — OXYCODONE-ACETAMINOPHEN 5-325 MG PO TABS: 1.0000 | ORAL_TABLET | Freq: Four times a day (QID) | ORAL | Status: DC | PRN

## 2014-01-21 MED ORDER — IBUPROFEN 800 MG PO TABS: 800.0000 mg | ORAL_TABLET | Freq: Three times a day (TID) | ORAL | Status: DC

## 2014-01-21 NOTE — Discharge Instructions (Signed)
Return in 48 hours for wound check. Call for a follow up appointment with a Family or Primary Care Provider.  Return if Symptoms worsen.   Take medication as prescribed.  Do not operate heavy machinery while taking narcotic pain medication. Warm compress to area at least 4 times a day. Keep the wound clean and dry, change dressing when saturated or at least 2 times a day.

## 2014-01-21 NOTE — ED Notes (Signed)
Pt c/o hitting head 2 weeks ago; pt sts now has abscess to wound on head that is red and swollen

## 2014-01-21 NOTE — ED Notes (Signed)
Pt A&OX4, ambulatory at d/c with steady gait, NAD 

## 2014-01-21 NOTE — ED Provider Notes (Signed)
CSN: [id number]     Arrival date & time 01/21/14  [id number] History  This chart was scribed for a non-physician practitioner, Mellody DrownLauren Kavaughn Faucett, PA-C working with Warnell Foresterrey Wofford, MD by [place] Peace, ED Scribe. The patient was seen in TR09C/TR09C. The patient's care was started at 8:36 PM.    Chief Complaint  Patient presents with  . Abscess      HPI Comments: James Wright is a 48 y.o. male who presents to the Emergency Department complaining of abscess/wound onset one week to right superior aspect of forehead with associated pain and redness. Pt states that he hit his head on the bottom of his sister's car while he was underneath it, working on it. He rates pain as very severe and describes it as "stabbing" sensation. He denies any allergies to his knowledge. Pt reports cleaning lesion thoroughly with water and soap, peroxide, and alcohol. He adds that he was also tried applying warm compresses to affected area without any improvement.  Pt reports history of similar occurrence one year ago. Pt unsure last Td.  Patient is a 48 y.o. male presenting with abscess. The history is provided by the patient. No language interpreter was used.  Abscess Associated symptoms: no fever      History reviewed. No pertinent past medical history. Past Surgical History  Procedure Laterality Date  . Back surgery     History reviewed. No pertinent family history. History  Substance Use Topics  . Smoking status: Current Every Day Smoker -- 1.00 packs/day    Types: Cigarettes  . Smokeless tobacco: Not on file  . Alcohol Use: No    Review of Systems  Constitutional: Negative for fever and chills.  Skin: Positive for color change and wound.      Allergies  Review of patient's allergies indicates no known allergies.  Home Medications   Prior to Admission medications   Medication Sig Start Date End Date Taking? Authorizing Provider  acetaminophen (TYLENOL) 500 MG tablet Take 1 tablet (500 mg total) by  mouth every 6 (six) hours as needed for pain. 12/10/12   Junius FinnerErin O'Malley, PA-C  clindamycin (CLEOCIN) 150 MG capsule Take 2 capsules (300 mg total) by mouth 3 (three) times daily. May dispense as 150mg  capsules 12/08/12   Roxy Horsemanobert Browning, PA-C  oxyCODONE-acetaminophen (PERCOCET/ROXICET) 5-325 MG per tablet Take 1-2 pills every 4-6 hours as needed for pain. 12/10/12   Junius FinnerErin O'Malley, PA-C  sulfamethoxazole-trimethoprim (BACTRIM DS) 800-160 MG per tablet Take 1 tablet by mouth 2 (two) times daily. 12/07/12   Nicole Pisciotta, PA-C   BP 138/90 mmHg  Pulse 106  Temp(Src) 98.3 F (36.8 C) (Oral)  Resp 18  Ht 6\' 2"  (1.88 m)  Wt 200 lb (90.719 kg)  BMI 25.67 kg/m2  SpO2 96% Physical Exam  Constitutional: He is oriented to person, place, and time. He appears well-developed and well-nourished. No distress.  HENT:  Head: Normocephalic.    3.5 x 3.5 cm area of swelling with associated erythema. Central scabbed lesion with surrounding purulent material. No drainage. Associated right upper face edema.  Eyes: Conjunctivae and EOM are normal.  Neck: Neck supple.  Pulmonary/Chest: Effort normal. No respiratory distress.  Musculoskeletal: Normal range of motion.  Neurological: He is alert and oriented to person, place, and time.  Skin: Skin is warm and dry.  Psychiatric: He has a normal mood and affect. His behavior is normal.  Nursing note and vitals reviewed.   ED Course  INCISION AND DRAINAGE Date/Time: 01/21/2014 9:54 PM Performed  by: Mellody DrownPARKER, Lipa Knauff Authorized by: Mellody DrownPARKER, Meliah Appleman Consent: Verbal consent obtained. Risks and benefits: risks, benefits and alternatives were discussed Consent given by: patient Patient understanding: patient states understanding of the procedure being performed Patient consent: the patient's understanding of the procedure matches consent given Procedure consent: procedure consent matches procedure scheduled Required items: required blood products, implants, devices,  and special equipment available Patient identity confirmed: verbally with patient and arm band Time out: Immediately prior to procedure a "time out" was called to verify the correct patient, procedure, equipment, support staff and site/side marked as required. Type: abscess Body area: head/neck Location details: face Anesthesia method: Pt declined anesthesia. Patient sedated: no Scalpel size: 11 Incision type: single straight Complexity: complex Drainage: purulent Drainage amount: moderate Wound treatment: wound left open Patient tolerance: Patient tolerated the procedure well with no immediate complications   (including critical care time) Labs Review Labs Reviewed - No data to display  Imaging Review No results found.   EKG Interpretation None     Medications - No data to display  8:40 PM- Treatment plan was discussed with patient who verbalizes understanding and agrees.   MDM   Final diagnoses:  Cellulitis and abscess of face   Patient with skin abscess amenable to incision and drainage, pt declined lidocaine.  Abscess was large enough to warrant packing, wound recheck in 2 days. Encouraged home warm compresses.  Mild signs of cellulitis is surrounding skin.  Will d/c to home with antibiotics. Td updated. Meds given in ED:  Medications  clindamycin (CLEOCIN) capsule 450 mg (450 mg Oral Given 01/21/14 2054)  oxyCODONE-acetaminophen (PERCOCET/ROXICET) 5-325 MG per tablet 1 tablet (1 tablet Oral Given 01/21/14 2101)  lidocaine-EPINEPHrine (XYLOCAINE W/EPI) 2 %-1:200000 (PF) injection 10 mL (10 mLs Intradermal Given 01/21/14 2101)  Tdap (BOOSTRIX) injection 0.5 mL (0.5 mLs Intramuscular Given 01/21/14 2151)    New Prescriptions   CLINDAMYCIN (CLEOCIN) 150 MG CAPSULE    Take 3 capsules (450 mg total) by mouth 4 (four) times daily.   IBUPROFEN (ADVIL,MOTRIN) 800 MG TABLET    Take 1 tablet (800 mg total) by mouth 3 (three) times daily.   OXYCODONE-ACETAMINOPHEN  (PERCOCET/ROXICET) 5-325 MG PER TABLET    Take 1 tablet by mouth every 6 (six) hours as needed for moderate pain or severe pain.   I personally performed the services described in this documentation, which was scribed in my presence. The recorded information has been reviewed and is accurate.   Mellody DrownLauren Perel Hauschild, PA-C 01/21/14 2159

## 2014-01-22 ENCOUNTER — Emergency Department (HOSPITAL_COMMUNITY)
Admission: EM | Admit: 2014-01-22 | Discharge: 2014-01-22 | Disposition: A | Discharge status: Home / Self Care | Payer: Medicare Other | Attending: Emergency Medicine | Admitting: Emergency Medicine

## 2014-01-22 ENCOUNTER — Encounter (HOSPITAL_COMMUNITY): Payer: Self-pay | Admitting: Emergency Medicine

## 2014-01-22 DIAGNOSIS — H00033 Abscess of eyelid right eye, unspecified eyelid: Secondary | ICD-10-CM | POA: Diagnosis not present

## 2014-01-22 DIAGNOSIS — Z792 Long term (current) use of antibiotics: Secondary | ICD-10-CM | POA: Diagnosis not present

## 2014-01-22 DIAGNOSIS — Z720 Tobacco use: Secondary | ICD-10-CM | POA: Diagnosis not present

## 2014-01-22 DIAGNOSIS — Z791 Long term (current) use of non-steroidal anti-inflammatories (NSAID): Secondary | ICD-10-CM | POA: Diagnosis not present

## 2014-01-22 DIAGNOSIS — W228XXD Striking against or struck by other objects, subsequent encounter: Secondary | ICD-10-CM | POA: Diagnosis not present

## 2014-01-22 DIAGNOSIS — R220 Localized swelling, mass and lump, head: Secondary | ICD-10-CM | POA: Diagnosis present

## 2014-01-22 MED ORDER — HYDROCODONE-ACETAMINOPHEN 5-325 MG PO TABS
1.0000 | ORAL_TABLET | Freq: Once | ORAL | Status: AC
  Administered 2014-01-22: 1 via ORAL
  Filled 2014-01-22: qty 1

## 2014-01-22 MED ORDER — FLUORESCEIN SODIUM 1 MG OP STRP
1.0000 | ORAL_STRIP | Freq: Once | OPHTHALMIC | Status: AC
  Administered 2014-01-22: 1 via OPHTHALMIC
  Filled 2014-01-22: qty 1

## 2014-01-22 MED ORDER — TETRACAINE HCL 0.5 % OP SOLN
1.0000 [drp] | Freq: Once | OPHTHALMIC | Status: AC
  Administered 2014-01-22: 2 [drp] via OPHTHALMIC
  Filled 2014-01-22: qty 2

## 2014-01-22 NOTE — ED Notes (Signed)
MD Walden at bedside 

## 2014-01-22 NOTE — ED Notes (Signed)
PA Courtney at bedside.  

## 2014-01-22 NOTE — Discharge Instructions (Signed)

## 2014-01-22 NOTE — ED Provider Notes (Signed)
CSN: [id number]     Arrival date & time 01/22/14  1113 History   First MD Initiated Contact with Patient 01/22/14 1121     Chief Complaint  Patient presents with  . Facial Swelling   HPI  Patient is a 48 y.o. Male who presents with right eye swelling.  Per the patient he hit his head on a muffler 1 week ago and then developed an abscess on his right frontal forehead.  [facility] and had the abscess drained and was given 450 mg oral clindamycin and percocet.  This morning when the patient woke up he developed right eye lid swelling.  He denies any eye pain or changes in vision.  He has no pain with eye movement.  [facility]    History reviewed. No pertinent past medical history. Past Surgical History  Procedure Laterality Date  . Back surgery     No family history on file. History  Substance Use Topics  . Smoking status: Current Every Day Smoker -- 1.00 packs/day    Types: Cigarettes  . Smokeless tobacco: Not on file  . Alcohol Use: No    Review of Systems  Constitutional: Negative for fever, chills and fatigue.  HENT: Positive for facial swelling.   Eyes: Negative for pain, redness and visual disturbance.  Gastrointestinal: Negative for nausea and vomiting.  All other systems reviewed and are negative.     Allergies  Review of patient's allergies indicates no known allergies.  Home Medications   Prior to Admission medications   Medication Sig Start Date End Date Taking? Authorizing Provider  acetaminophen (TYLENOL) 500 MG tablet Take 1 tablet (500 mg total) by mouth every 6 (six) hours as needed for pain. 12/10/12  Yes Junius FinnerErin O'Malley, PA-C  clindamycin (CLEOCIN) 150 MG capsule Take 3 capsules (450 mg total) by mouth 4 (four) times daily. 01/21/14  Yes Mellody DrownLauren Parker, PA-C  ibuprofen (ADVIL,MOTRIN) 800 MG tablet Take 1 tablet (800 mg total) by mouth 3 (three) times daily. 01/21/14  Yes Mellody DrownLauren Parker, PA-C   oxyCODONE-acetaminophen (PERCOCET/ROXICET) 5-325 MG per tablet Take 1 tablet by mouth every 6 (six) hours as needed for moderate pain or severe pain. 01/21/14  Yes Mellody DrownLauren Parker, PA-C  oxyCODONE-acetaminophen (PERCOCET/ROXICET) 5-325 MG per tablet Take 1-2 pills every 4-6 hours as needed for pain. Patient not taking: Reported on 01/22/2014 12/10/12   Junius FinnerErin O'Malley, PA-C  sulfamethoxazole-trimethoprim (BACTRIM DS) 800-160 MG per tablet Take 1 tablet by mouth 2 (two) times daily. Patient not taking: Reported on 01/22/2014 12/07/12   Joni ReiningNicole Pisciotta, PA-C   BP 105/76 mmHg  Pulse 94  Temp(Src) 98.9 F (37.2 C) (Oral)  Resp 14  Ht 6\' 2"  (1.88 m)  Wt 210 lb (95.255 kg)  BMI 26.95 kg/m2  SpO2 95% Physical Exam  Constitutional: He is oriented to person, place, and time. He appears well-developed and well-nourished. No distress.  HENT:  Head: Normocephalic and atraumatic.  Mouth/Throat: Oropharynx is clear and moist. No oropharyngeal exudate.  1 cm linear incision which is clean and dry.  There is minimal surrounding erythema and warmth.  Eyes: EOM are normal. Pupils are equal, round, and reactive to light. Right conjunctiva is injected. Left conjunctiva is injected. No scleral icterus.  Right eye lid swelling with no warmth or significant erythema.  There is no increased uptake of fluoroscein dye with woods lamp examination.     Neck: Normal range of motion.  Neck supple. No JVD present. No thyromegaly present.  Cardiovascular: Normal rate, regular rhythm, normal heart sounds and intact distal pulses.  Exam reveals no gallop and no friction rub.   No murmur heard. Pulmonary/Chest: Effort normal and breath sounds normal. No respiratory distress. He has no wheezes. He has no rales. He exhibits no tenderness.  Lymphadenopathy:    He has no cervical adenopathy.  Neurological: He is alert and oriented to person, place, and time.  Skin: Skin is warm and dry. He is not diaphoretic.  Psychiatric: He has  a normal mood and affect. His behavior is normal. Judgment and thought content normal.  Nursing note and vitals reviewed.   ED Course  Procedures (including critical care time) Labs Review Labs Reviewed - No data to display  Imaging Review No results found.   EKG Interpretation None      MDM   Final diagnoses:  Eyelid cellulitis, right   Patient is a 48 y.o. Male who presents with right eye swelling.  There is no movement pain with EOM.  Patient is afebrile.  There are no corneal abnormalities visible at this time.  Suspect preseptal cellulitis vs. Dependent edema.  Patient currently on clindamycin 450 mg TID.  Patient given dose here and pain medication.  Patient to be discharged home and told to return in 1 day for recheck. Patient to return for signs of orbital cellulitis.  Patient states understanding and agreement.  Patient is stable for discharge.  Patient discussed and seen by Dr. Gwendolyn GrantWalden who agrees with the above plan and discharge.      Eben Burowourtney A Forcucci, PA-C 01/22/14 1433

## 2014-01-22 NOTE — ED Notes (Signed)
Patient states was treated for cellulitis and I&D last night on R forehead.  Patient woke up this morning with R side and eye swelling.

## 2014-01-23 ENCOUNTER — Encounter (HOSPITAL_COMMUNITY): Payer: Self-pay | Admitting: Emergency Medicine

## 2014-01-23 ENCOUNTER — Emergency Department (HOSPITAL_COMMUNITY): Payer: Medicare Other

## 2014-01-23 ENCOUNTER — Emergency Department (HOSPITAL_COMMUNITY)
Admission: EM | Admit: 2014-01-23 | Discharge: 2014-01-23 | Disposition: A | Discharge status: Home / Self Care | Payer: Medicare Other | Attending: Emergency Medicine | Admitting: Emergency Medicine

## 2014-01-23 DIAGNOSIS — L03211 Cellulitis of face: Secondary | ICD-10-CM

## 2014-01-23 DIAGNOSIS — L0201 Cutaneous abscess of face: Secondary | ICD-10-CM | POA: Insufficient documentation

## 2014-01-23 DIAGNOSIS — Z792 Long term (current) use of antibiotics: Secondary | ICD-10-CM | POA: Diagnosis not present

## 2014-01-23 DIAGNOSIS — L0390 Cellulitis, unspecified: Secondary | ICD-10-CM

## 2014-01-23 DIAGNOSIS — Z720 Tobacco use: Secondary | ICD-10-CM | POA: Diagnosis not present

## 2014-01-23 LAB — CBC WITH DIFFERENTIAL/PLATELET
BASOS PCT: 1 % (ref 0–1)
Basophils Absolute: 0.1 10*3/uL (ref 0.0–0.1)
EOS ABS: 0.6 10*3/uL (ref 0.0–0.7)
Eosinophils Relative: 6 % — ABNORMAL HIGH (ref 0–5)
HEMATOCRIT: 48.3 % (ref 39.0–52.0)
HEMOGLOBIN: 16.4 g/dL (ref 13.0–17.0)
Lymphocytes Relative: 24 % (ref 12–46)
Lymphs Abs: 2.4 10*3/uL (ref 0.7–4.0)
MCH: 30 pg (ref 26.0–34.0)
MCHC: 34 g/dL (ref 30.0–36.0)
MCV: 88.3 fL (ref 78.0–100.0)
MONO ABS: 1 10*3/uL (ref 0.1–1.0)
Monocytes Relative: 10 % (ref 3–12)
NEUTROS ABS: 5.9 10*3/uL (ref 1.7–7.7)
Neutrophils Relative %: 59 % (ref 43–77)
Platelets: 330 10*3/uL (ref 150–400)
RBC: 5.47 MIL/uL (ref 4.22–5.81)
RDW: 13.8 % (ref 11.5–15.5)
WBC: 10 10*3/uL (ref 4.0–10.5)

## 2014-01-23 LAB — BASIC METABOLIC PANEL
ANION GAP: 10 (ref 5–15)
BUN: 7 mg/dL (ref 6–23)
CALCIUM: 9.5 mg/dL (ref 8.4–10.5)
CHLORIDE: 103 meq/L (ref 96–112)
CO2: 26 meq/L (ref 19–32)
CREATININE: 0.94 mg/dL (ref 0.50–1.35)
GFR calc Af Amer: >90 mL/min (ref >90)
GFR calc non Af Amer: >90 mL/min (ref >90)
Glucose, Bld: 102 mg/dL — ABNORMAL HIGH (ref 70–99)
Potassium: 4.5 mEq/L (ref 3.7–5.3)
Sodium: 139 mEq/L (ref 137–147)

## 2014-01-23 MED ORDER — OXYCODONE-ACETAMINOPHEN 5-325 MG PO TABS: 1.0000 | ORAL_TABLET | Freq: Four times a day (QID) | ORAL | Status: DC | PRN

## 2014-01-23 MED ORDER — OXYCODONE-ACETAMINOPHEN 5-325 MG PO TABS
1.0000 | ORAL_TABLET | Freq: Once | ORAL | Status: AC
  Administered 2014-01-23: 1 via ORAL
  Filled 2014-01-23: qty 1

## 2014-01-23 MED ORDER — IOHEXOL 300 MG/ML  SOLN
75.0000 mL | Freq: Once | INTRAMUSCULAR | Status: AC | PRN
  Administered 2014-01-23: 75 mL via INTRAVENOUS

## 2014-01-23 NOTE — ED Provider Notes (Signed)
CSN: [id number]     Arrival date & time 01/23/14  1234 History   First MD Initiated Contact with Patient 01/23/14 1606     Chief Complaint  Patient presents with  . Recurrent Skin Infections  . Facial Swelling     (Consider location/radiation/quality/duration/timing/severity/associated sxs/prior Treatment) HPI  Patient to the ER with complaints or right eye swelling and right upper forehead abscess/cellulitis. He was seen on 11/2 and had an I&D.  He return on 11/3 and had not yet started his abx, although he had them with him, and was determined to be better than the previous day. At this point his eye was swollen shut, there was not associated pus drainage from the abscess.  He took first dose of his medication at that visit.Today the patient says that he is now able to open his eye and that the swelling and redness to his eye and abscess have both improved from yesterday. He has had a total of 5 doses of medication. He has not had fevers, n/v/d. He is feeling well.  History reviewed. No pertinent past medical history. Past Surgical History  Procedure Laterality Date  . Back surgery     No family history on file. History  Substance Use Topics  . Smoking status: Current Every Day Smoker -- 1.00 packs/day    Types: Cigarettes  . Smokeless tobacco: Not on file  . Alcohol Use: No    Review of Systems  10 Systems reviewed and are negative for acute change except as noted in the HPI.     Allergies  Review of patient's allergies indicates no known allergies.  Home Medications   Prior to Admission medications   Medication Sig Start Date End Date Taking? Authorizing Provider  acetaminophen (TYLENOL) 500 MG tablet Take 1 tablet (500 mg total) by mouth every 6 (six) hours as needed for pain. 12/10/12   Junius FinnerErin O'Malley, PA-C  clindamycin (CLEOCIN) 150 MG capsule Take 3 capsules (450 mg total) by mouth 4 (four) times daily. 01/21/14   Mellody DrownLauren Parker, PA-C  ibuprofen (ADVIL,MOTRIN) 800 MG  tablet Take 1 tablet (800 mg total) by mouth 3 (three) times daily. 01/21/14   Mellody DrownLauren Parker, PA-C  oxyCODONE-acetaminophen (PERCOCET/ROXICET) 5-325 MG per tablet Take 1-2 pills every 4-6 hours as needed for pain. Patient not taking: Reported on 01/22/2014 12/10/12   Junius FinnerErin O'Malley, PA-C  oxyCODONE-acetaminophen (PERCOCET/ROXICET) 5-325 MG per tablet Take 1-2 tablets by mouth every 6 (six) hours as needed for moderate pain or severe pain. 01/23/14   Flor Whitacre Irine SealG Kareema Keitt, PA-C  sulfamethoxazole-trimethoprim (BACTRIM DS) 800-160 MG per tablet Take 1 tablet by mouth 2 (two) times daily. Patient not taking: Reported on 01/22/2014 12/07/12   Joni ReiningNicole Pisciotta, PA-C   BP 119/71 mmHg  Pulse 70  Temp(Src) 98.3 F (36.8 C) (Oral)  Resp 16  SpO2 95% Physical Exam  HENT:  Head: Head is with right periorbital erythema.  Small abscess to upper right forehead, mild tenderness. No drainage.  Eyes: Conjunctivae and EOM are normal. Pupils are equal, round, and reactive to light.          ED Course  Procedures (including critical care time) Labs Review Labs Reviewed  BASIC METABOLIC PANEL - Abnormal; Notable for the following:    Glucose, Bld 102 (*)    All other components within normal limits  CBC WITH DIFFERENTIAL - Abnormal; Notable for the following:    Eosinophils Relative 6 (*)    All other components within normal limits    Imaging  Review Ct Orbits W/cm  01/23/2014   CLINICAL DATA:  Swelling around right orbit. Recheck abscess right upper forehead.  EXAM: CT ORBITS WITH CONTRAST  TECHNIQUE: Multidetector CT imaging of the orbits was performed following the bolus administration of intravenous contrast.  CONTRAST:  75mL OMNIPAQUE IOHEXOL 300 MG/ML  SOLN  COMPARISON:  12/08/2012  FINDINGS: There is mild soft tissue swelling is in the right orbit and temporal region within the subcutaneous soft tissues suggesting cellulitis. No fluid collection to suggest abscess. Intraorbital soft tissues are  unremarkable. No acute bony abnormality. Ventral lysed intracranial structures unremarkable.  IMPRESSION: No focal fluid collection to suggest abscess. There is stranding in the subcutaneous soft tissues over the right orbit, cheek and temple region suggesting cellulitis.   Electronically Signed   By: Charlett NoseKevin  Dover M.D.   On: 01/23/2014 15:23     EKG Interpretation None      MDM   Final diagnoses:  Cellulitis of face    Terri Piedraourtney Forcucci, PA-C saw the patient yesterday and was availble to review patient with me. She says that his physical exam looks remarkably improved from yesterday. CT scan of the orbits was done as a triage order and there is no fluid collection but associated cellulitis. Labs are reassuring.  Pt is to continue Clindamycin and return for wound check in 2-3 days or sooner if symptoms change or worsen.  48 y.o.James Wright's evaluation in the Emergency Department is complete. It has been determined that no acute conditions requiring further emergency intervention are present at this time. The patient/guardian have been advised of the diagnosis and plan. We have discussed signs and symptoms that warrant return to the ED, such as changes or worsening in symptoms.  Vital signs are stable at discharge. Filed Vitals:   01/23/14 1613  BP: 119/71  Pulse: 70  Temp:   Resp: 16    Patient/guardian has voiced understanding and agreed to follow-up with the PCP or specialist.       Dorthula Matasiffany G Margaruite Top, PA-C 01/23/14 1628  Dorthula Matasiffany G Hennessy Bartel, PA-C 01/23/14 1631  Vanetta MuldersScott Zackowski, MD 01/23/14 [phone]

## 2014-01-23 NOTE — ED Notes (Signed)
Here for recheck of abscess on right upper forehead--- swelling now around right eye-- able to open right eye-- pt is blind in left eye

## 2014-01-23 NOTE — ED Notes (Signed)
Applied guaze dressing to forehead.

## 2014-01-23 NOTE — ED Notes (Signed)
Pager [phone] 

## 2014-01-23 NOTE — Discharge Instructions (Signed)
Abscess Care After An abscess (also called a boil or furuncle) is an infected area that contains a collection of pus. Signs and symptoms of an abscess include pain, tenderness, redness, or hardness, or you may feel a moveable soft area under your skin. An abscess can occur anywhere in the body. The infection may spread to surrounding tissues causing cellulitis. A cut (incision) by the surgeon was made over your abscess and the pus was drained out. Gauze may have been packed into the space to provide a drain that will allow the cavity to heal from the inside outwards. The boil may be painful for 5 to 7 days. Most people with a boil do not have high fevers. Your abscess, if seen early, may not have localized, and may not have been lanced. If not, another appointment may be required for this if it does not get better on its own or with medications. HOME CARE INSTRUCTIONS   Only take over-the-counter or prescription medicines for pain, discomfort, or fever as directed by your caregiver.  When you bathe, soak and then remove gauze or iodoform packs at least daily or as directed by your caregiver. You may then wash the wound gently with mild soapy water. Repack with gauze or do as your caregiver directs. SEEK IMMEDIATE MEDICAL CARE IF:   You develop increased pain, swelling, redness, drainage, or bleeding in the wound site.  You develop signs of generalized infection including muscle aches, chills, fever, or a general ill feeling.  An oral temperature above 102 F (38.9 C) develops, not controlled by medication. See your caregiver for a recheck if you develop any of the symptoms described above. If medications (antibiotics) were prescribed, take them as directed. Document Released: 09/24/2004 Document Revised: 05/31/2011 Document Reviewed: 05/22/2007 [facility] Patient Information 2015 [place], [place]. This information is not intended to replace advice given to you by your health care provider. Make sure  you discuss any questions you have with your health care provider.  Facial Infection You have an infection of your face. This requires special attention to help prevent serious problems. Infections in facial wounds can cause poor healing and scars. They can also spread to deeper tissues, especially around the eye. Wound and dental infections can lead to sinusitis, infection of the eye socket, and even meningitis. Permanent damage to the skin, eye, and nervous system may result if facial infections are not treated properly. With severe infections, hospital care for IV antibiotic injections may be needed if they don't respond to oral antibiotics. Antibiotics must be taken for the full course to insure the infection is eliminated. If the infection came from a bad tooth, it may have to be extracted when the infection is under control. Warm compresses may be applied to reduce skin irritation and remove drainage. You might need a tetanus shot now if:  You cannot remember when your last tetanus shot was.  You have never had a tetanus shot.  The object that caused your wound was dirty. If you need a tetanus shot, and you decide not to get one, there is a rare chance of getting tetanus. Sickness from tetanus can be serious. If you got a tetanus shot, your arm may swell, get red and warm to the touch at the shot site. This is common and not a problem. SEEK IMMEDIATE MEDICAL CARE IF:   You have increased swelling, redness, or trouble breathing.  You have a severe headache, dizziness, nausea, or vomiting.  You develop problems with your eyesight.  You have a fever. Document Released: 04/15/2004 Document Revised: 05/31/2011 Document Reviewed: 03/08/2005 [facility] Patient Information 2015 [place]. This information is not intended to replace advice given to you by your health care provider. Make sure you discuss any questions you have with your health care provider.

## 2015-12-20 IMAGING — CT CT ORBITS W/ CM
4 series · 18 of 47 positions shown, 19 images · IV contrast (omnipaque)
Comparison: 12/08/2012

CLINICAL DATA: Swelling around right orbit. Recheck abscess right
upper forehead.

EXAM:
CT ORBITS WITH CONTRAST
TECHNIQUE: Multidetector CT imaging of the orbits was performed following the
bolus administration of intravenous contrast.
CONTRAST:  75mL OMNIPAQUE IOHEXOL 300 MG/ML  SOLN

[Series 201: orbits st · axial · 0.43mm/px · z∈[+73,+139]mm · 4 of 57 slices shown, 5 images]
[im 12/57  brain]
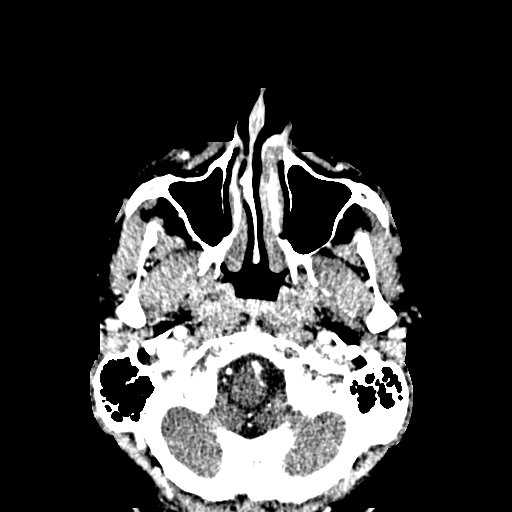
[im 12/57  bone]
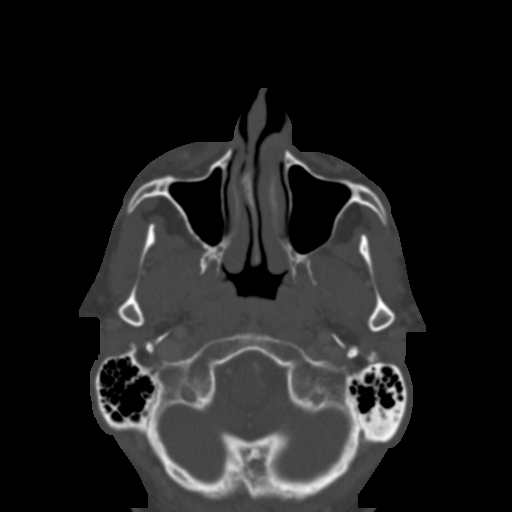
[im 23/57  bone]
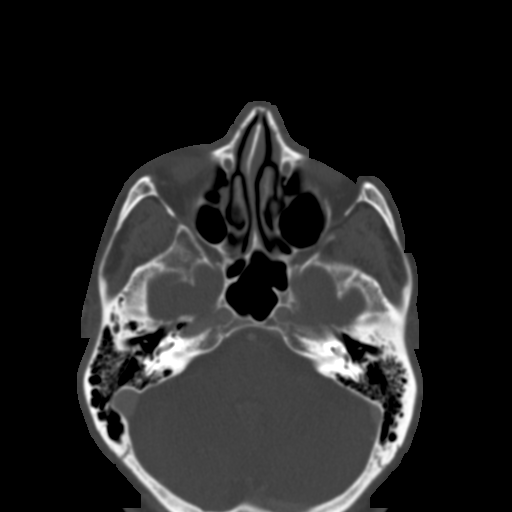
[im 34/57  bone]
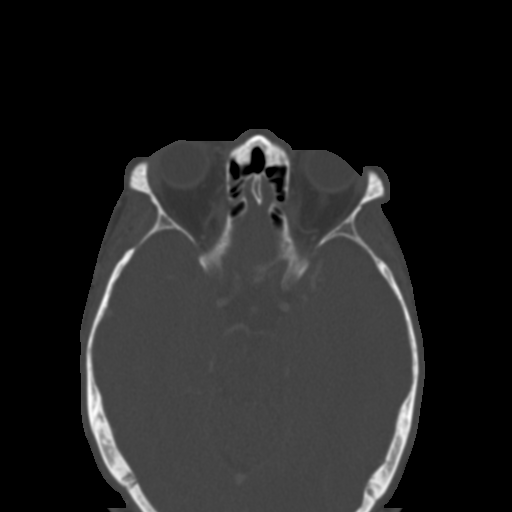
[im 45/57  bone]
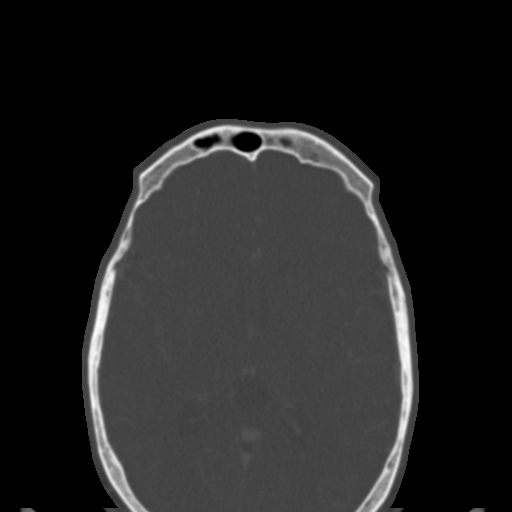

[Series 203: coronal std · coronal · 0.34mm/px · 3 of 97 slices shown]
[im 33/97  bone]
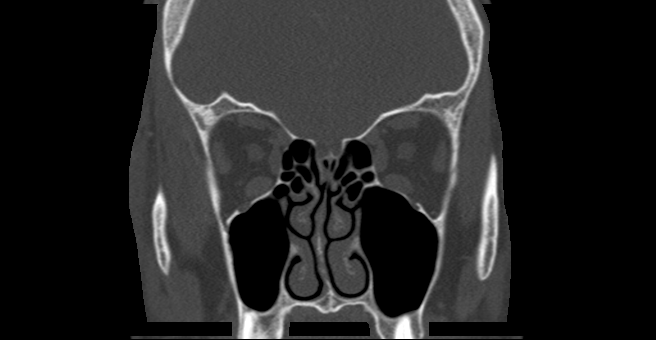
[im 43/97  bone]
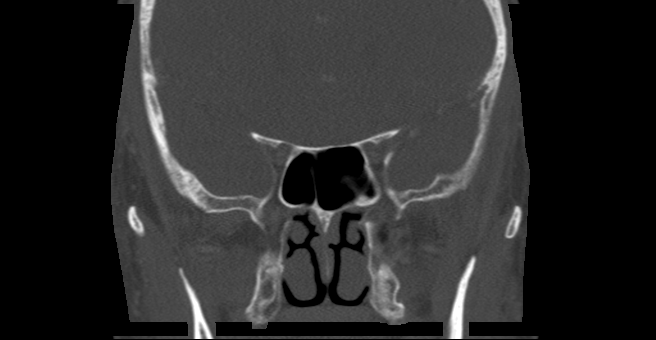
[im 54/97  bone]
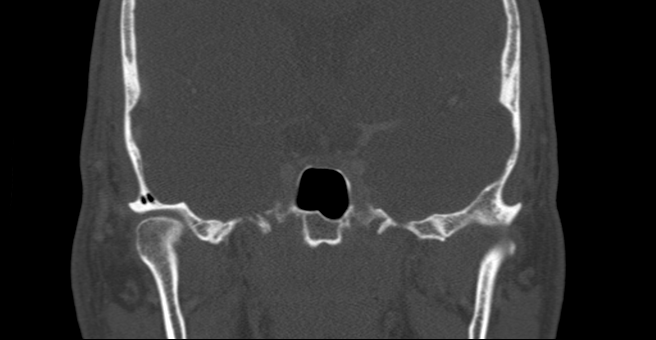

[Series 204: sagittal std · sagittal · 0.34mm/px · 3 of 110 slices shown]
[im 37/110  bone]
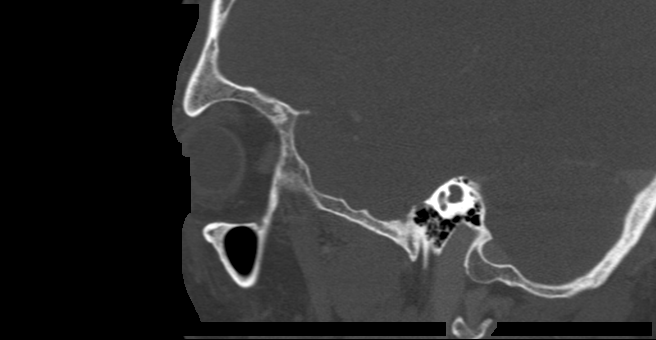
[im 55/110  bone]
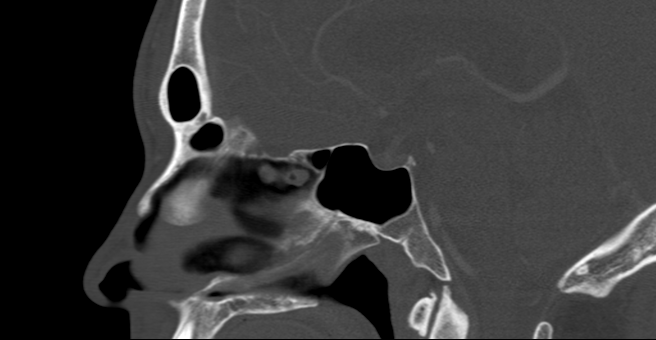
[im 73/110  bone]
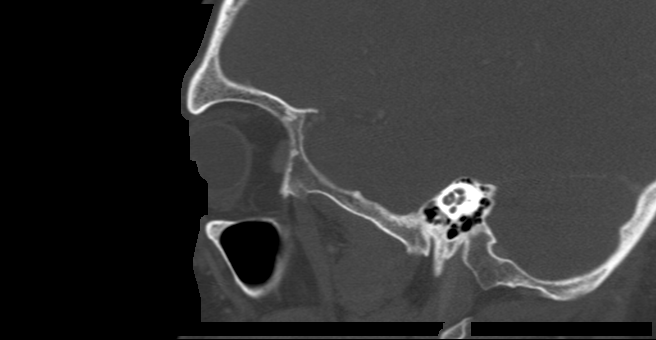

[Series 207: orbits std thin · axial · 0.43mm/px · z∈[+60,+154]mm · 8 of 252 slices shown]
[im 22/252  brain]
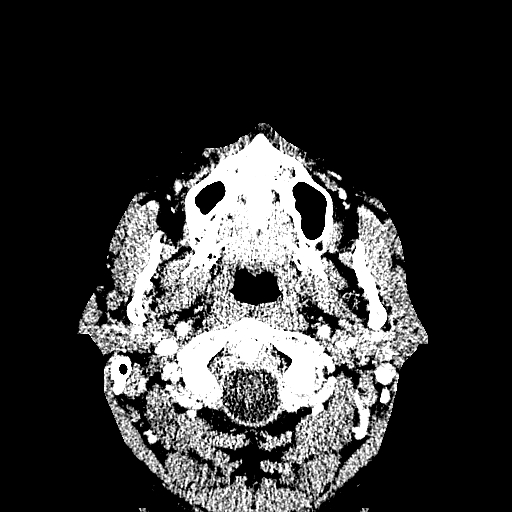
[im 55/252  brain]
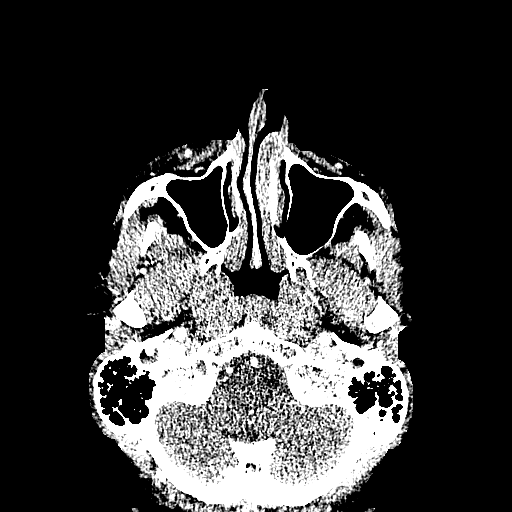
[im 77/252  brain]
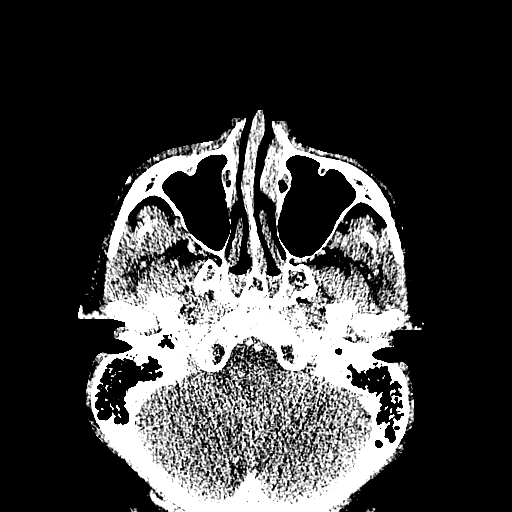
[im 110/252  brain]
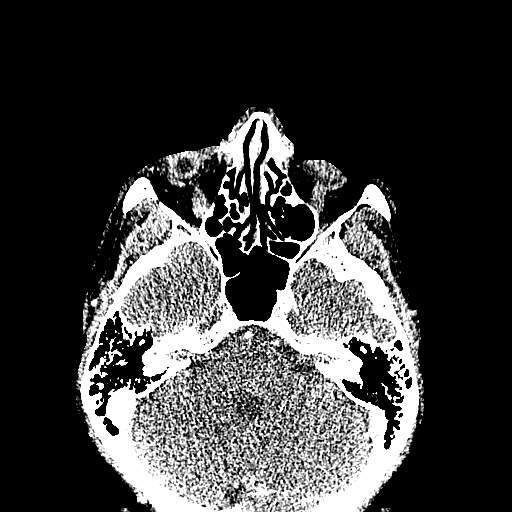
[im 142/252  brain]
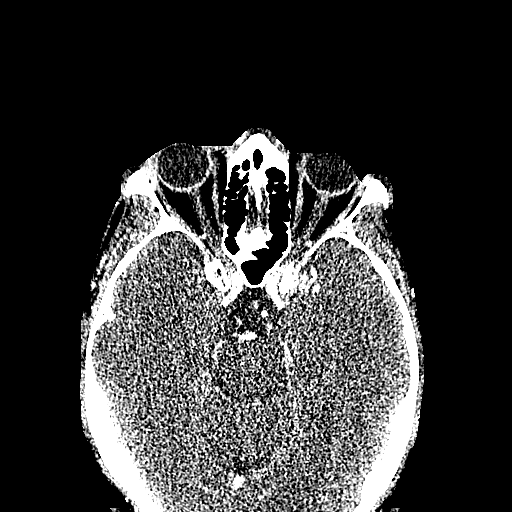
[im 175/252  brain]
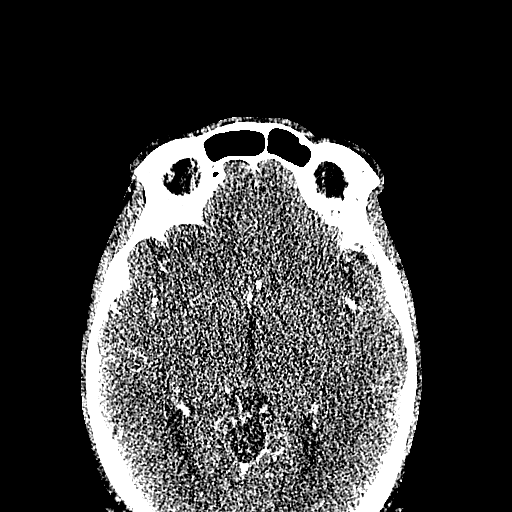
[im 197/252  brain]
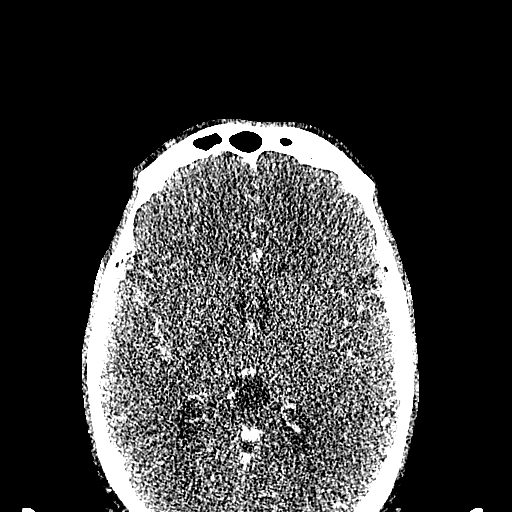
[im 230/252  brain]
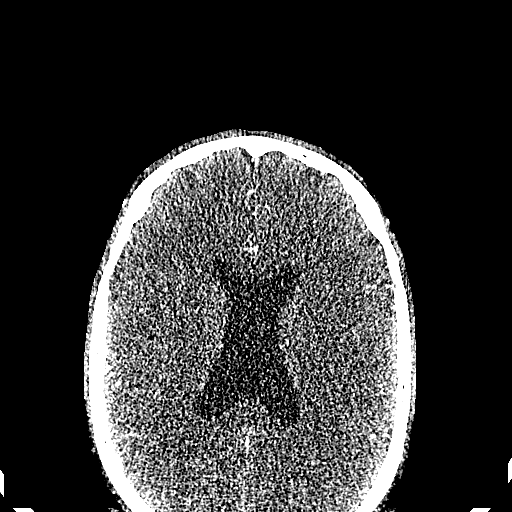

[18 of 47 positions shown; findings below may reference images not displayed]

FINDINGS: There is mild soft tissue swelling is in the right orbit and
temporal region within the subcutaneous soft tissues suggesting
cellulitis. No fluid collection to suggest abscess. Intraorbital
soft tissues are unremarkable. No acute bony abnormality. Ventral
lysed intracranial structures unremarkable.
IMPRESSION: No focal fluid collection to suggest abscess. There is stranding in
the subcutaneous soft tissues over the right orbit, cheek and temple
region suggesting cellulitis.

## 2016-10-05 ENCOUNTER — Encounter (HOSPITAL_COMMUNITY): Payer: Self-pay

## 2016-10-05 ENCOUNTER — Emergency Department (HOSPITAL_COMMUNITY)
Admission: EM | Admit: 2016-10-05 | Discharge: 2016-10-05 | Disposition: A | Discharge status: Home / Self Care | Payer: Medicare Other | Attending: Emergency Medicine | Admitting: Emergency Medicine

## 2016-10-05 DIAGNOSIS — F329 Major depressive disorder, single episode, unspecified: Secondary | ICD-10-CM | POA: Insufficient documentation

## 2016-10-05 DIAGNOSIS — S40862A Insect bite (nonvenomous) of left upper arm, initial encounter: Secondary | ICD-10-CM | POA: Diagnosis not present

## 2016-10-05 DIAGNOSIS — Y9389 Activity, other specified: Secondary | ICD-10-CM | POA: Diagnosis not present

## 2016-10-05 DIAGNOSIS — L03114 Cellulitis of left upper limb: Secondary | ICD-10-CM | POA: Insufficient documentation

## 2016-10-05 DIAGNOSIS — Y92017 Garden or yard in single-family (private) house as the place of occurrence of the external cause: Secondary | ICD-10-CM | POA: Diagnosis not present

## 2016-10-05 DIAGNOSIS — Z79899 Other long term (current) drug therapy: Secondary | ICD-10-CM | POA: Diagnosis not present

## 2016-10-05 DIAGNOSIS — W57XXXA Bitten or stung by nonvenomous insect and other nonvenomous arthropods, initial encounter: Secondary | ICD-10-CM | POA: Diagnosis not present

## 2016-10-05 DIAGNOSIS — Y998 Other external cause status: Secondary | ICD-10-CM | POA: Insufficient documentation

## 2016-10-05 DIAGNOSIS — F17210 Nicotine dependence, cigarettes, uncomplicated: Secondary | ICD-10-CM | POA: Diagnosis not present

## 2016-10-05 LAB — BASIC METABOLIC PANEL
Anion gap: 7 (ref 5–15)
BUN: 9 mg/dL (ref 6–20)
CO2: 23 mmol/L (ref 22–32)
Calcium: 9.3 mg/dL (ref 8.9–10.3)
Chloride: 107 mmol/L (ref 101–111)
Creatinine, Ser: 0.9 mg/dL (ref 0.61–1.24)
GFR calc Af Amer: >60 mL/min (ref >60)
GFR calc non Af Amer: >60 mL/min (ref >60)
Glucose, Bld: 112 mg/dL — ABNORMAL HIGH (ref 65–99)
Potassium: 4.2 mmol/L (ref 3.5–5.1)
Sodium: 137 mmol/L (ref 135–145)

## 2016-10-05 LAB — CBC WITH DIFFERENTIAL/PLATELET
Basophils Absolute: 0.1 10*3/uL (ref 0.0–0.1)
Basophils Relative: 0 %
Eosinophils Absolute: 0.3 10*3/uL (ref 0.0–0.7)
Eosinophils Relative: 2 %
HCT: 49.5 % (ref 39.0–52.0)
Hemoglobin: 17.4 g/dL — ABNORMAL HIGH (ref 13.0–17.0)
Lymphocytes Relative: 15 %
Lymphs Abs: 2.5 10*3/uL (ref 0.7–4.0)
MCH: 30.6 pg (ref 26.0–34.0)
MCHC: 35.2 g/dL (ref 30.0–36.0)
MCV: 87.1 fL (ref 78.0–100.0)
Monocytes Absolute: 1.9 10*3/uL — ABNORMAL HIGH (ref 0.1–1.0)
Monocytes Relative: 12 %
Neutro Abs: 11.9 10*3/uL — ABNORMAL HIGH (ref 1.7–7.7)
Neutrophils Relative %: 71 %
Platelets: 296 10*3/uL (ref 150–400)
RBC: 5.68 MIL/uL (ref 4.22–5.81)
RDW: 14.3 % (ref 11.5–15.5)
WBC: 16.6 10*3/uL — ABNORMAL HIGH (ref 4.0–10.5)

## 2016-10-05 LAB — I-STAT CG4 LACTIC ACID, ED: Lactic Acid, Venous: 1.14 mmol/L (ref 0.5–1.9)

## 2016-10-05 MED ORDER — IBUPROFEN 600 MG PO TABS: 600.0000 mg | ORAL_TABLET | Freq: Four times a day (QID) | ORAL | 0 refills | Status: DC | PRN

## 2016-10-05 MED ORDER — ACETAMINOPHEN 500 MG PO TABS: 500.0000 mg | ORAL_TABLET | Freq: Four times a day (QID) | ORAL | 0 refills | Status: DC | PRN

## 2016-10-05 MED ORDER — DOXYCYCLINE HYCLATE 100 MG PO TABS
100.0000 mg | ORAL_TABLET | Freq: Once | ORAL | Status: AC
  Administered 2016-10-05: 100 mg via ORAL
  Filled 2016-10-05: qty 1

## 2016-10-05 MED ORDER — SODIUM CHLORIDE 0.9 % IV BOLUS (SEPSIS)
1000.0000 mL | Freq: Once | INTRAVENOUS | Status: AC
  Administered 2016-10-05: 1000 mL via INTRAVENOUS

## 2016-10-05 MED ORDER — DOXYCYCLINE HYCLATE 100 MG PO CAPS: 100.0000 mg | ORAL_CAPSULE | Freq: Two times a day (BID) | ORAL | 0 refills | Status: DC

## 2016-10-05 NOTE — ED Triage Notes (Signed)
Per Pt, Pt was working in his garden two days ago when he notice some stinging to his left arm. Denied seeing an insect, but noted a bite and redness. Over the past two days, pt has reports increase in redness and swelling along with black head at the original site.

## 2016-10-05 NOTE — ED Notes (Signed)
Patient has redness and swelling to left elbow.

## 2016-10-05 NOTE — Discharge Instructions (Signed)
Medications: Doxycycline, ibuprofen, Tylenol  Treatment: Take doxycycline twice daily as prescribed. You have already received yuor first dose in the emergency department. Take another dose before you go to bed tonight. This medication can make you sunburn easily, wear protective clothing and hats and avoid being in the sun as much as possible. Take ibuprofen and Tylenol as prescribed every 6 hours. You can alternate by taking one kind every 3 hours.  Follow-up: Please return to emergency Department in 2 days for wound check. Please return sooner if you develop any new or worsening symptoms including spread of redness, swelling, or warmth outside of the margin, fever over 100.4, significant increasing pain, or any other new or concerning symptoms. Please follow-up and establish care with your primary care provider as scheduled.

## 2016-10-05 NOTE — Care Management (Signed)
ED CM consulted by Dr. Rex Kras concerning medication affordability. Patient has Medicare/Medicaid with $3 co-pay per prescription. Updated Dr. Rex Kras. CM met with patient at bedside to discuss follow up care, patient is agreeable with care transition plan. Discussed the [facility] patient is agreeable with establishing  care at the clinic appt scheduled Tuesday 7/24 at 1:30pm. Contact information provided and placed on AVS. No further ED CM needs identified.

## 2016-10-05 NOTE — ED Provider Notes (Signed)
[facility]-EMERGENCY DEPT Provider Note   CSN: [id number] Arrival date & time: 10/05/16  [id number]     History   Chief Complaint Chief Complaint  Patient presents with  . Insect Bite    HPI James Wright is a 51 y.o. male with history of lumbar disc herniation, lumbar radiculopathy, depression who presents with a 2 day history of wound to left arm with associated redness and swelling. Patient reports he was gardening 2 days ago and came in to the house and felt a stinging, burning sensation on his left elbow where there is currently a wound. There was no mark or redness initially. Patient reports progressive pain, redness, and swelling, and wanted over the past 2 days. Patient has had associated nausea. He denies any fevers. Patient took 2 Aleve this morning without relief. Patient denies any numbness or tingling, chest pain, shortness of breath, abdominal pain, vomiting.  HPI  History reviewed. No pertinent past medical history.  Patient Active Problem List   Diagnosis Date Noted  . DENTAL PAIN 01/18/2008  . DEPRESSION 09/21/2007  . INSOMNIA 09/21/2007  . LUMBAR RADICULOPATHY 12/20/2005  . HERNIATED LUMBAR DISC 02/25/2005    Past Surgical History:  Procedure Laterality Date  . BACK SURGERY         Home Medications    Prior to Admission medications   Medication Sig Start Date End Date Taking? Authorizing Provider  acetaminophen (TYLENOL) 500 MG tablet Take 1 tablet (500 mg total) by mouth every 6 (six) hours as needed. 10/05/16   Sidharth Leverette, Waylan BogaAlexandra M, PA-C  clindamycin (CLEOCIN) 150 MG capsule Take 3 capsules (450 mg total) by mouth 4 (four) times daily. 01/21/14   Mellody DrownParker, Lauren, PA-C  doxycycline (VIBRAMYCIN) 100 MG capsule Take 1 capsule (100 mg total) by mouth 2 (two) times daily. 10/05/16   Jolaine Fryberger, Waylan BogaAlexandra M, PA-C  ibuprofen (ADVIL,MOTRIN) 600 MG tablet Take 1 tablet (600 mg total) by mouth every 6 (six) hours as needed. 10/05/16   Morgin Halls, Waylan BogaAlexandra M, PA-C  oxyCODONE-acetaminophen  (PERCOCET/ROXICET) 5-325 MG per tablet Take 1-2 pills every 4-6 hours as needed for pain. Patient not taking: Reported on 01/22/2014 12/10/12   Lurene ShadowPhelps, Erin O, PA-C  oxyCODONE-acetaminophen (PERCOCET/ROXICET) 5-325 MG per tablet Take 1-2 tablets by mouth every 6 (six) hours as needed for moderate pain or severe pain. 01/23/14   Marlon PelGreene, Tiffany, PA-C  sulfamethoxazole-trimethoprim (BACTRIM DS) 800-160 MG per tablet Take 1 tablet by mouth 2 (two) times daily. Patient not taking: Reported on 01/22/2014 12/07/12   Pisciotta, Mardella LaymanNicole, PA-C    Family History No family history on file.  Social History Social History  Substance Use Topics  . Smoking status: Current Every Day Smoker    Packs/day: 1.00    Types: Cigarettes  . Smokeless tobacco: Never Used  . Alcohol use No     Allergies   Patient has no known allergies.   Review of Systems Review of Systems  Constitutional: Negative for chills and fever.  HENT: Negative for facial swelling and sore throat.   Respiratory: Negative for shortness of breath.   Cardiovascular: Negative for chest pain.  Gastrointestinal: Positive for nausea. Negative for abdominal pain and vomiting.  Genitourinary: Negative for dysuria.  Musculoskeletal: Positive for joint swelling and myalgias. Negative for back pain.  Skin: Positive for color change and wound. Negative for rash.  Neurological: Negative for headaches.  Psychiatric/Behavioral: The patient is not nervous/anxious.      Physical Exam Updated Vital Signs BP 123/77 (BP Location: Right Arm)  Pulse 79   Temp 99.2 F (37.3 C) (Oral)   Resp 16   Ht 6\' 2"  (1.88 m)   Wt 95.3 kg (210 lb)   SpO2 98%   BMI 26.96 kg/m   Physical Exam  Constitutional: He appears well-developed and well-nourished. No distress.  HENT:  Head: Normocephalic and atraumatic.  Mouth/Throat: Oropharynx is clear and moist. No oropharyngeal exudate.  Eyes: Pupils are equal, round, and reactive to light. Conjunctivae are  normal. Right eye exhibits no discharge. Left eye exhibits no discharge. No scleral icterus.  Neck: Normal range of motion. Neck supple. No thyromegaly present.  Cardiovascular: Regular rhythm, normal heart sounds and intact distal pulses.  Exam reveals no gallop and no friction rub.   No murmur heard. Pulmonary/Chest: Effort normal and breath sounds normal. No stridor. No respiratory distress. He has no wheezes. He has no rales.  Abdominal: Soft. Bowel sounds are normal. He exhibits no distension. There is no tenderness. There is no rebound and no guarding.  Musculoskeletal: He exhibits no edema.  ~1cm scabbed wound to the lateral left elbow with surrounding erythema; tenderness, erythema, warmth, edema surrounding extending to midforearm and into the posterior upper arm; full range of motion intact; normal sensation; radial pulse intact; cap refill < 2 secs  Lymphadenopathy:    He has no cervical adenopathy.  Neurological: He is alert. Coordination normal.  Skin: Skin is warm and dry. No rash noted. He is not diaphoretic. No pallor.  Psychiatric: He has a normal mood and affect.  Nursing note and vitals reviewed.        ED Treatments / Results  Labs (all labs ordered are listed, but only abnormal results are displayed) Labs Reviewed  BASIC METABOLIC PANEL - Abnormal; Notable for the following:       Result Value   Glucose, Bld 112 (*)    All other components within normal limits  CBC WITH DIFFERENTIAL/PLATELET - Abnormal; Notable for the following:    WBC 16.6 (*)    Hemoglobin 17.4 (*)    Neutro Abs 11.9 (*)    Monocytes Absolute 1.9 (*)    All other components within normal limits  I-STAT CG4 LACTIC ACID, ED    EKG  EKG Interpretation None       Radiology No results found.  Procedures Procedures (including critical care time)  Medications Ordered in ED Medications  sodium chloride 0.9 % bolus 1,000 mL (0 mLs Intravenous Stopped 10/05/16 1327)  doxycycline  (VIBRA-TABS) tablet 100 mg (100 mg Oral Given 10/05/16 1344)     Initial Impression / Assessment and Plan / ED Course  I have reviewed the triage vital signs and the nursing notes.  Pertinent labs & imaging results that were available during my care of the patient were reviewed by me and considered in my medical decision making (see chart for details).     Patient with cellulitis of left arm. WBC is 16.6, however stable creatinine, glucose, vitals, and no previous outpatient antibiotic for this problem. Lactate is within normal limits. Patient given first dose of doxycycline in the ED. Also given IV fluids which resolved and initial tachycardia. Patient is to return for wound check in 2 days or sooner if he develops any new or worsening symptoms or the area of swelling, redness, or warmth extends outside the margin drawn on his skin by myself. Patient understands and agrees with plan. Patient also set up with PCP by social work to establish care. Patient vitals stable and discharged  in satisfactory condition. Patient also evaluated by Dr. Clarene Duke who guided the patient's management and agrees with plan.  Final Clinical Impressions(s) / ED Diagnoses   Final diagnoses:  Cellulitis of left upper extremity  Insect bite, initial encounter    New Prescriptions New Prescriptions   ACETAMINOPHEN (TYLENOL) 500 MG TABLET    Take 1 tablet (500 mg total) by mouth every 6 (six) hours as needed.   DOXYCYCLINE (VIBRAMYCIN) 100 MG CAPSULE    Take 1 capsule (100 mg total) by mouth 2 (two) times daily.   IBUPROFEN (ADVIL,MOTRIN) 600 MG TABLET    Take 1 tablet (600 mg total) by mouth every 6 (six) hours as needed.         Emi Holes, PA-C 10/05/16 1408    Little, Ambrose Finland, MD 10/06/16 [phone]

## 2016-10-12 ENCOUNTER — Ambulatory Visit: Payer: Medicare Other | Admitting: Family Medicine

## 2016-12-10 ENCOUNTER — Encounter: Payer: Self-pay | Admitting: Family Medicine

## 2016-12-10 ENCOUNTER — Ambulatory Visit (INDEPENDENT_AMBULATORY_CARE_PROVIDER_SITE_OTHER): Payer: Medicare Other | Admitting: Family Medicine

## 2016-12-10 VITALS — BP 113/66 | HR 84 | Temp 98.7°F | Resp 16 | Ht 73.0 in | Wt 192.0 lb

## 2016-12-10 DIAGNOSIS — S3992XA Unspecified injury of lower back, initial encounter: Secondary | ICD-10-CM

## 2016-12-10 DIAGNOSIS — M7918 Myalgia, other site: Secondary | ICD-10-CM

## 2016-12-10 DIAGNOSIS — R739 Hyperglycemia, unspecified: Secondary | ICD-10-CM

## 2016-12-10 DIAGNOSIS — F17200 Nicotine dependence, unspecified, uncomplicated: Secondary | ICD-10-CM | POA: Diagnosis not present

## 2016-12-10 DIAGNOSIS — L0231 Cutaneous abscess of buttock: Secondary | ICD-10-CM

## 2016-12-10 DIAGNOSIS — H5440 Blindness, one eye, unspecified eye: Secondary | ICD-10-CM

## 2016-12-10 DIAGNOSIS — Z114 Encounter for screening for human immunodeficiency virus [HIV]: Secondary | ICD-10-CM | POA: Diagnosis not present

## 2016-12-10 LAB — POCT GLYCOSYLATED HEMOGLOBIN (HGB A1C): HEMOGLOBIN A1C: 5.7

## 2016-12-10 MED ORDER — SULFAMETHOXAZOLE-TRIMETHOPRIM 800-160 MG PO TABS: 1.0000 | ORAL_TABLET | Freq: Two times a day (BID) | ORAL | 0 refills | Status: DC

## 2016-12-10 MED ORDER — ACETAMINOPHEN-CODEINE #3 300-30 MG PO TABS: 1.0000 | ORAL_TABLET | Freq: Three times a day (TID) | ORAL | 0 refills | Status: DC | PRN

## 2016-12-10 NOTE — Patient Instructions (Addendum)
You have an abscess to your left buttocks. Will start Bactrim 80-160 mg every 12 hours for 14 days.  Apply warm, moist compresses to left buttocks as needed.   Skin Abscess A skin abscess is an infected area on or under your skin that contains a collection of pus and other material. An abscess may also be called a furuncle, carbuncle, or boil. An abscess can occur in or on almost any part of your body. Some abscesses break open (rupture) on their own. Most continue to get worse unless they are treated. The infection can spread deeper into the body and eventually into your blood, which can make you feel ill. Treatment usually involves draining the abscess. What are the causes? An abscess occurs when germs, often bacteria, pass through your skin and cause an infection. This may be caused by:  A scrape or cut on your skin.  A puncture wound through your skin, including a needle injection.  Blocked oil or sweat glands.  Blocked and infected hair follicles.  A cyst that forms beneath your skin (sebaceous cyst) and becomes infected.  What increases the risk? This condition is more likely to develop in people who:  Have a weak body defense system (immune system).  Have diabetes.  Have dry and irritated skin.  Get frequent injections or use illegal IV drugs.  Have a foreign body in a wound, such as a splinter.  Have problems with their lymph system or veins.  What are the signs or symptoms? An abscess may start as a painful, firm bump under the skin. Over time, the abscess may get larger or become softer. Pus may appear at the top of the abscess, causing pressure and pain. It may eventually break through the skin and drain. Other symptoms include:  Redness.  Warmth.  Swelling.  Tenderness.  A sore on the skin.  How is this diagnosed? This condition is diagnosed based on your medical history and a physical exam. A sample of pus may be taken from the abscess to find out what is  causing the infection and what antibiotics can be used to treat it. You also may have:  Blood tests to look for signs of infection or spread of an infection to your blood.  Imaging studies such as ultrasound, CT scan, or MRI if the abscess is deep.  How is this treated? Small abscesses that drain on their own may not need treatment. Treatment for an abscess that does not rupture on its own may include:  Warm compresses applied to the area several times per day.  Incision and drainage. Your health care provider will make an incision to open the abscess and will remove pus and any foreign body or dead tissue. The incision area may be packed with gauze to keep it open for a few days while it heals.  Antibiotic medicines to treat infection. For a severe abscess, you may first get antibiotics through an IV and then change to oral antibiotics.  Follow these instructions at home: Abscess Care  If you have an abscess that has not drained, place a warm, clean, wet washcloth over the abscess several times a day. Do this as told by your health care provider.  Follow instructions from your health care provider about how to take care of your abscess. Make sure you: ? Cover the abscess with a bandage (dressing). ? Change your dressing or gauze as told by your health care provider. ? Wash your hands with soap and water before  you change the dressing or gauze. If soap and water are not available, use hand sanitizer.  Check your abscess every day for signs of a worsening infection. Check for: ? More redness, swelling, or pain. ? More fluid or blood. ? Warmth. ? More pus or a bad smell. Medicines  Take over-the-counter and prescription medicines only as told by your health care provider.  If you were prescribed an antibiotic medicine, take it as told by your health care provider. Do not stop taking the antibiotic even if you start to feel better. General instructions  To avoid spreading the  infection: ? Do not share personal care items, towels, or hot tubs with others. ? Avoid making skin contact with other people.  Keep all follow-up visits as told by your health care provider. This is important. Contact a health care provider if:  You have more redness, swelling, or pain around your abscess.  You have more fluid or blood coming from your abscess.  Your abscess feels warm to the touch.  You have more pus or a bad smell coming from your abscess.  You have a fever.  You have muscle aches.  You have chills or a general ill feeling. Get help right away if:  You have severe pain.  You see red streaks on your skin spreading away from the abscess. This information is not intended to replace advice given to you by your health care provider. Make sure you discuss any questions you have with your health care provider. Document Released: 12/16/2004 Document Revised: 11/02/2015 Document Reviewed: 01/15/2015 Elsevier Interactive Patient Education  [employer].
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
## 2016-12-11 LAB — COMPLETE METABOLIC PANEL WITH GFR
AG Ratio: 1.6 (calc) (ref 1.0–2.5)
ALBUMIN MSPROF: 4.1 g/dL (ref 3.6–5.1)
ALKALINE PHOSPHATASE (APISO): 86 U/L (ref 40–115)
ALT: 17 U/L (ref 9–46)
AST: 12 U/L (ref 10–40)
BILIRUBIN TOTAL: 0.6 mg/dL (ref 0.2–1.2)
BUN: 10 mg/dL (ref 7–25)
CHLORIDE: 104 mmol/L (ref 98–110)
CO2: 23 mmol/L (ref 20–32)
Calcium: 8.9 mg/dL (ref 8.6–10.3)
Creat: 1.17 mg/dL (ref 0.60–1.35)
GFR, Est African American: 84 mL/min/{1.73_m2} (ref >=60)
GFR, Est Non African American: 73 mL/min/{1.73_m2} (ref >=60)
GLOBULIN: 2.6 g/dL (ref 1.9–3.7)
Glucose, Bld: 79 mg/dL (ref 65–99)
POTASSIUM: 4.2 mmol/L (ref 3.5–5.3)
SODIUM: 138 mmol/L (ref 135–146)
Total Protein: 6.7 g/dL (ref 6.1–8.1)

## 2016-12-11 LAB — CBC WITH DIFFERENTIAL/PLATELET
BASOS ABS: 97 {cells}/uL (ref 0–200)
Basophils Relative: 0.8 %
EOS PCT: 3.2 %
Eosinophils Absolute: 387 cells/uL (ref 15–500)
HEMATOCRIT: 47.3 % (ref 38.5–50.0)
Hemoglobin: 16.4 g/dL (ref 13.2–17.1)
LYMPHS ABS: 2287 {cells}/uL (ref 850–3900)
MCH: 30 pg (ref 27.0–33.0)
MCHC: 34.7 g/dL (ref 32.0–36.0)
MCV: 86.6 fL (ref 80.0–100.0)
MPV: 10 fL (ref 7.5–12.5)
Monocytes Relative: 10.5 %
NEUTROS PCT: 66.6 %
Neutro Abs: 8059 cells/uL — ABNORMAL HIGH (ref 1500–7800)
Platelets: 352 10*3/uL (ref 140–400)
RBC: 5.46 10*6/uL (ref 4.20–5.80)
RDW: 13.6 % (ref 11.0–15.0)
Total Lymphocyte: 18.9 %
WBC mixed population: 1271 cells/uL — ABNORMAL HIGH (ref 200–950)
WBC: 12.1 10*3/uL — ABNORMAL HIGH (ref 3.8–10.8)

## 2016-12-11 LAB — HIV ANTIBODY (ROUTINE TESTING W REFLEX): HIV 1&2 Ab, 4th Generation: NONREACTIVE

## 2016-12-24 ENCOUNTER — Ambulatory Visit (INDEPENDENT_AMBULATORY_CARE_PROVIDER_SITE_OTHER): Payer: Medicare Other | Admitting: Family Medicine

## 2016-12-24 VITALS — BP 111/64 | HR 74 | Temp 98.3°F | Resp 16 | Ht 73.0 in | Wt 196.0 lb

## 2016-12-24 DIAGNOSIS — L0293 Carbuncle, unspecified: Secondary | ICD-10-CM | POA: Diagnosis not present

## 2016-12-24 DIAGNOSIS — F17200 Nicotine dependence, unspecified, uncomplicated: Secondary | ICD-10-CM | POA: Diagnosis not present

## 2016-12-24 NOTE — Progress Notes (Signed)
   Subjective:    Patient ID: James Wright, male    DOB: 11-18-1965, 51 y.o.   MRN: [id number]  HPI James Wright, a very pleasant 51 year old male presents for a follow up of abscess to left buttocks. He says that abscess has improved and he denies pain on today. He completed Bactrim 800-160 mg over 10 days. He says that he feels well and with without complaint. He denies fever, fatigue, pain, nausea, vomiting, or diarrhea.  Mr. James Wright is a chronic every day smoker. He has been smoking 0.5 ppd over the past 30 years.      Past Medical History:  Diagnosis Date  . Blindness of left eye    Social History   Social History  . Marital status: Divorced    Spouse name: N/A  . Number of children: N/A  . Years of education: N/A   Occupational History  . Not on file.   Social History Main Topics  . Smoking status: Current Every Day Smoker    Packs/day: 1.00    Types: Cigarettes  . Smokeless tobacco: Never Used  . Alcohol use Yes     Comment: occ  . Drug use: Yes    Types: Marijuana     Comment: daily  . Sexual activity: Not on file   Other Topics Concern  . Not on file   Social History Narrative  . No narrative on file   Review of Systems  Constitutional: Negative.  Negative for fatigue.  HENT: Negative.   Respiratory: Negative.   Cardiovascular: Negative.   Gastrointestinal: Negative.   Endocrine: Negative for polydipsia, polyphagia and polyuria.  Genitourinary: Negative.   Musculoskeletal: Negative.  Negative for arthralgias and back pain.  Allergic/Immunologic: Negative.  Negative for immunocompromised state.  Neurological: Negative.   Psychiatric/Behavioral: Negative.        Objective:   Physical Exam  Constitutional: He is oriented to person, place, and time.  Cardiovascular: Normal rate, regular rhythm, normal heart sounds and intact distal pulses.   Pulmonary/Chest: Effort normal and breath sounds normal.  Abdominal: Soft. Bowel sounds are normal.   Musculoskeletal: Normal range of motion.  Neurological: He is alert and oriented to person, place, and time. He has normal reflexes.  Skin: Skin is warm, dry and intact.         BP 111/64 (BP Location: Left Arm, Patient Position: Sitting, Cuff Size: Large)   Pulse 74   Temp 98.3 F (36.8 C) (Oral)   Resp 16   Ht  (1.854 m)   Wt 196 lb (88.9 kg)   SpO2 98%   BMI 25.86 kg/m  Assessment & Plan:  1. Recurrent boils Left buttocks abscess resolved with Bactrim 800-160 mg for 10 days.  Patient to continue to bathe with Dial anti-bacterial soap.  Patient expressed understanding  2. Tobacco dependence Smoking cessation instruction/counseling given:  counseled patient on the dangers of tobacco use, advised patient to stop smoking, and reviewed strategies to maximize success   RTC: 6 months for a complete physical with prostate exam   James Nations  MSN, FNP-C [facility] [address] [place], [place] [address] [phone]

## 2016-12-24 NOTE — Patient Instructions (Signed)
Please follow up in January 2019 for a complete physical exam.   Skin Abscess A skin abscess is an infected area on or under your skin that contains pus and other material. An abscess can happen almost anywhere on your body. Some abscesses break open (rupture) on their own. Most continue to get worse unless they are treated. The infection can spread deeper into the body and into your blood, which can make you feel sick. Treatment usually involves draining the abscess. Follow these instructions at home: Abscess Care  If you have an abscess that has not drained, place a warm, clean, wet washcloth over the abscess several times a day. Do this as told by your doctor.  Follow instructions from your doctor about how to take care of your abscess. Make sure you: ? Cover the abscess with a bandage (dressing). ? Change your bandage or gauze as told by your doctor. ? Wash your hands with soap and water before you change the bandage or gauze. If you cannot use soap and water, use hand sanitizer.  Check your abscess every day for signs that the infection is getting worse. Check for: ? More redness, swelling, or pain. ? More fluid or blood. ? Warmth. ? More pus or a bad smell. Medicines   Take over-the-counter and prescription medicines only as told by your doctor.  If you were prescribed an antibiotic medicine, take it as told by your doctor. Do not stop taking the antibiotic even if you start to feel better. General instructions  To avoid spreading the infection: ? Do not share personal care items, towels, or hot tubs with others. ? Avoid making skin-to-skin contact with other people.  Keep all follow-up visits as told by your doctor. This is important. Contact a doctor if:  You have more redness, swelling, or pain around your abscess.  You have more fluid or blood coming from your abscess.  Your abscess feels warm when you touch it.  You have more pus or a bad smell coming from your  abscess.  You have a fever.  Your muscles ache.  You have chills.  You feel sick. Get help right away if:  You have very bad (severe) pain.  You see red streaks on your skin spreading away from the abscess. This information is not intended to replace advice given to you by your health care provider. Make sure you discuss any questions you have with your health care provider. Document Released: 08/25/2007 Document Revised: 11/02/2015 Document Reviewed: 01/15/2015 Elsevier Interactive Patient Education  [employer].

## 2016-12-26 ENCOUNTER — Encounter: Payer: Self-pay | Admitting: Family Medicine

## 2017-02-16 ENCOUNTER — Ambulatory Visit (INDEPENDENT_AMBULATORY_CARE_PROVIDER_SITE_OTHER): Payer: Medicare Other | Admitting: Family Medicine

## 2017-02-16 ENCOUNTER — Encounter: Payer: Self-pay | Admitting: Family Medicine

## 2017-02-16 VITALS — BP 135/80 | HR 77 | Temp 98.4°F | Resp 16 | Ht 73.0 in | Wt 200.0 lb

## 2017-02-16 DIAGNOSIS — F17200 Nicotine dependence, unspecified, uncomplicated: Secondary | ICD-10-CM | POA: Diagnosis not present

## 2017-02-16 DIAGNOSIS — L02214 Cutaneous abscess of groin: Secondary | ICD-10-CM

## 2017-02-16 DIAGNOSIS — R1031 Right lower quadrant pain: Secondary | ICD-10-CM | POA: Diagnosis not present

## 2017-02-16 HISTORY — DX: Nicotine dependence, unspecified, uncomplicated: F17.200

## 2017-02-16 LAB — CBC WITH DIFFERENTIAL/PLATELET
Basophils Absolute: 125 cells/uL (ref 0–200)
Basophils Relative: 1.2 %
Eosinophils Absolute: 437 cells/uL (ref 15–500)
Eosinophils Relative: 4.2 %
HCT: 50.9 % — ABNORMAL HIGH (ref 38.5–50.0)
HEMOGLOBIN: 17.1 g/dL (ref 13.2–17.1)
Lymphs Abs: 2891 cells/uL (ref 850–3900)
MCH: 29.4 pg (ref 27.0–33.0)
MCHC: 33.6 g/dL (ref 32.0–36.0)
MCV: 87.5 fL (ref 80.0–100.0)
MONOS PCT: 9 %
MPV: 10 fL (ref 7.5–12.5)
Neutro Abs: 6011 cells/uL (ref 1500–7800)
Neutrophils Relative %: 57.8 %
Platelets: 344 10*3/uL (ref 140–400)
RBC: 5.82 10*6/uL — ABNORMAL HIGH (ref 4.20–5.80)
RDW: 13.7 % (ref 11.0–15.0)
Total Lymphocyte: 27.8 %
WBC mixed population: 936 cells/uL (ref 200–950)
WBC: 10.4 10*3/uL (ref 3.8–10.8)

## 2017-02-16 MED ORDER — CEPHALEXIN 500 MG PO CAPS: 500.0000 mg | ORAL_CAPSULE | Freq: Three times a day (TID) | ORAL | 0 refills | Status: DC

## 2017-02-16 MED ORDER — TRAMADOL HCL 50 MG PO TABS
50.0000 mg | ORAL_TABLET | Freq: Three times a day (TID) | ORAL | 0 refills | Status: DC | PRN
Start: 2017-02-16 — End: 2017-02-16

## 2017-02-16 MED ORDER — TRAMADOL HCL 50 MG PO TABS
50.0000 mg | ORAL_TABLET | Freq: Three times a day (TID) | ORAL | 0 refills | Status: DC | PRN
Start: 2017-02-16 — End: 2017-11-17

## 2017-02-16 NOTE — Patient Instructions (Signed)
He had a right groin abscess.  We will start Keflex 500 mg 3 times a day for 10 days.  We will also start tramadol 50 mg every 8 hours for moderate to severe pain.  Follow-up in 2 weeks. Skin Abscess A skin abscess is an infected area on or under your skin that contains pus and other material. An abscess can happen almost anywhere on your body. Some abscesses break open (rupture) on their own. Most continue to get worse unless they are treated. The infection can spread deeper into the body and into your blood, which can make you feel sick. Treatment usually involves draining the abscess. Follow these instructions at home: Abscess Care  If you have an abscess that has not drained, place a warm, clean, wet washcloth over the abscess several times a day. Do this as told by your doctor.  Follow instructions from your doctor about how to take care of your abscess. Make sure you: ? Cover the abscess with a bandage (dressing). ? Change your bandage or gauze as told by your doctor. ? Wash your hands with soap and water before you change the bandage or gauze. If you cannot use soap and water, use hand sanitizer.  Check your abscess every day for signs that the infection is getting worse. Check for: ? More redness, swelling, or pain. ? More fluid or blood. ? Warmth. ? More pus or a bad smell. Medicines   Take over-the-counter and prescription medicines only as told by your doctor.  If you were prescribed an antibiotic medicine, take it as told by your doctor. Do not stop taking the antibiotic even if you start to feel better. General instructions  To avoid spreading the infection: ? Do not share personal care items, towels, or hot tubs with others. ? Avoid making skin-to-skin contact with other people.  Keep all follow-up visits as told by your doctor. This is important. Contact a doctor if:  You have more redness, swelling, or pain around your abscess.  You have more fluid or blood coming  from your abscess.  Your abscess feels warm when you touch it.  You have more pus or a bad smell coming from your abscess.  You have a fever.  Your muscles ache.  You have chills.  You feel sick. Get help right away if:  You have very bad (severe) pain.  You see red streaks on your skin spreading away from the abscess. This information is not intended to replace advice given to you by your health care provider. Make sure you discuss any questions you have with your health care provider. Document Released: 08/25/2007 Document Revised: 11/02/2015 Document Reviewed: 01/15/2015 Elsevier Interactive Patient Education  [employer]2018 Elsevier Inc.

## 2017-02-16 NOTE — Progress Notes (Signed)
Subjective:    Patient ID: James Wright, male    DOB: 01/31/1966, 51 y.o.   MRN: [id number]  HPI James GuerinWilliam Wright, a very pleasant 51 year old male presents complaining of an abscess to right groin.  James Wright is complaining of recurrent boils and abscesses.He was treated for a boil to left buttocks 1 month ago.  Abscess has been present for 3-4 days. Current pain intensity is 6/10 described as constant. He has been taking Ibuprofen without sustained relief. The patient does not have type 2 diabetes mellitus.   James Wright is a chronic every day smoker. He has been smoking 0.5 ppd over the past 30 years.      Past Medical History:  Diagnosis Date  . Blindness of left eye    Social History   Socioeconomic History  . Marital status: Divorced    Spouse name: Not on file  . Number of children: Not on file  . Years of education: Not on file  . Highest education level: Not on file  Social Needs  . Financial resource strain: Not on file  . Food insecurity - worry: Not on file  . Food insecurity - inability: Not on file  . Transportation needs - medical: Not on file  . Transportation needs - non-medical: Not on file  Occupational History  . Not on file  Tobacco Use  . Smoking status: Current Every Day Smoker    Packs/day: 1.00    Types: Cigarettes  . Smokeless tobacco: Never Used  Substance and Sexual Activity  . Alcohol use: Yes    Comment: occ  . Drug use: Yes    Types: Marijuana    Comment: daily  . Sexual activity: Not on file  Other Topics Concern  . Not on file  Social History Narrative  . Not on file   Review of Systems  Constitutional: Negative.  Negative for fatigue.  HENT: Negative.   Respiratory: Negative.   Cardiovascular: Negative.   Gastrointestinal: Negative.   Endocrine: Negative for polydipsia, polyphagia and polyuria.  Genitourinary: Negative.   Musculoskeletal: Negative.  Negative for arthralgias and back pain.  Skin: Positive for wound  (abscess of right groin).  Allergic/Immunologic: Negative.  Negative for immunocompromised state.  Neurological: Negative.   Psychiatric/Behavioral: Negative.        Objective:   Physical Exam  Constitutional: He is oriented to person, place, and time.  HENT:  Head: Normocephalic and atraumatic.  Right Ear: External ear normal.  Left Ear: External ear normal.  Nose: Nose normal.  Mouth/Throat: Oropharynx is clear and moist.  Eyes: Conjunctivae are normal. Pupils are equal, round, and reactive to light.  Neck: Normal range of motion.  Cardiovascular: Normal rate, regular rhythm, normal heart sounds and intact distal pulses.  Pulmonary/Chest: Effort normal and breath sounds normal.  Abdominal: Soft. Bowel sounds are normal.  Musculoskeletal: Normal range of motion.  Neurological: He is alert and oriented to person, place, and time. He has normal reflexes.  Skin:            BP 135/80 (BP Location: Right Arm, Patient Position: Sitting, Cuff Size: Normal)   Pulse 77   Temp 98.4 F (36.9 C) (Oral)   Resp 16   Ht 6\' 1"  (1.854 m)   Wt 200 lb (90.7 kg)   SpO2 98%   BMI 26.39 kg/m  Assessment & Plan:  1. Abscess of groin, right - cephALEXin (KEFLEX) 500 MG capsule; Take 1 capsule (500 mg total) by mouth  3 (three) times daily for 10 days.  Dispense: 30 capsule; Refill: 0 - WOUND CULTURE - CBC with Differential  2. Groin pain, right - traMADol (ULTRAM) 50 MG tablet; Take 1 tablet (50 mg total) by mouth every 8 (eight) hours as needed.  Dispense: 15 tablet; Refill: 0  3. Tobacco dependence Smoking cessation instruction/counseling given:  counseled patient on the dangers of tobacco use, advised patient to stop smoking, and reviewed strategies to maximize success    RTC: 2 weeks for abscess of right groin   Nolon NationsLaChina Moore Virgia Kelner  MSN, FNP-C Patient Care [facility] [address]  , [place] [address] [phone]

## 2017-02-19 ENCOUNTER — Other Ambulatory Visit: Payer: Self-pay | Admitting: Family Medicine

## 2017-02-19 DIAGNOSIS — L02214 Cutaneous abscess of groin: Secondary | ICD-10-CM

## 2017-02-19 LAB — WOUND CULTURE
MICRO NUMBER:: 81336392
SPECIMEN QUALITY:: ADEQUATE

## 2017-02-19 MED ORDER — SULFAMETHOXAZOLE-TRIMETHOPRIM 800-160 MG PO TABS: 1.0000 | ORAL_TABLET | Freq: Two times a day (BID) | ORAL | 0 refills | Status: AC

## 2017-02-19 NOTE — Progress Notes (Signed)
Meds ordered this encounter  Medications  . sulfamethoxazole-trimethoprim (BACTRIM DS,SEPTRA DS) 800-160 MG tablet    Sig: Take 1 tablet by mouth 2 (two) times daily for 14 days.    Dispense:  28 tablet    Refill:  0    Nolon NationsLaChina Moore Ludmila Ebarb  MSN, FNP-C Patient [facility] [address]  [place], [place] [address] [phone]

## 2017-03-02 ENCOUNTER — Ambulatory Visit: Payer: Medicare Other | Admitting: Family Medicine

## 2017-03-07 ENCOUNTER — Ambulatory Visit (INDEPENDENT_AMBULATORY_CARE_PROVIDER_SITE_OTHER): Payer: Medicare Other | Admitting: Family Medicine

## 2017-03-07 ENCOUNTER — Encounter: Payer: Self-pay | Admitting: Family Medicine

## 2017-03-07 VITALS — BP 110/66 | HR 78 | Temp 97.9°F | Resp 16 | Ht 73.0 in | Wt 203.0 lb

## 2017-03-07 DIAGNOSIS — F17200 Nicotine dependence, unspecified, uncomplicated: Secondary | ICD-10-CM

## 2017-03-07 DIAGNOSIS — L02214 Cutaneous abscess of groin: Secondary | ICD-10-CM | POA: Diagnosis not present

## 2017-03-07 NOTE — Patient Instructions (Signed)
Abscess improved on antibiotic therapy. Continue to wash with dial antibacterial soap.

## 2017-03-07 NOTE — Progress Notes (Signed)
Subjective:    Patient ID: James Wright, male    DOB: 10/22/1965, 51 y.o.   MRN: [id number]  HPI James GuerinWilliam Wright, a very pleasant 51 year old male presents for a follow up of abscess to right groin.  James Wright has a history of recurrent boils and abscesses. Patient was treated with Bactrim 800-1 60 for an abscess to the right groin 2 weeks ago.  Patient has completed antibiotic therapy.  He states that he no longer has drainage to abscess and denies pain.  Patient denies fever, fatigue, chest pains, nausea, vomiting, or diarrhea. James Wright is a chronic every day smoker. He has been smoking 0.5 ppd over the past 30 years.   Past Medical History:  Diagnosis Date  . Blindness of left eye    Social History   Socioeconomic History  . Marital status: Divorced    Spouse name: Not on file  . Number of children: Not on file  . Years of education: Not on file  . Highest education level: Not on file  Social Needs  . Financial resource strain: Not on file  . Food insecurity - worry: Not on file  . Food insecurity - inability: Not on file  . Transportation needs - medical: Not on file  . Transportation needs - non-medical: Not on file  Occupational History  . Not on file  Tobacco Use  . Smoking status: Current Every Day Smoker    Packs/day: 1.00    Types: Cigarettes  . Smokeless tobacco: Never Used  Substance and Sexual Activity  . Alcohol use: Yes    Comment: occ  . Drug use: Yes    Types: Marijuana    Comment: daily  . Sexual activity: Not on file  Other Topics Concern  . Not on file  Social History Narrative  . Not on file   Review of Systems  Constitutional: Negative.  Negative for fatigue.  HENT: Negative.   Respiratory: Negative.   Cardiovascular: Negative.   Gastrointestinal: Negative.   Endocrine: Negative for polydipsia, polyphagia and polyuria.  Genitourinary: Negative.   Musculoskeletal: Negative.  Negative for arthralgias and back pain.  Skin: Positive  for wound (abscess of right groin).  Allergic/Immunologic: Negative.  Negative for immunocompromised state.  Neurological: Negative.   Psychiatric/Behavioral: Negative.        Objective:   Physical Exam  Constitutional: He is oriented to person, place, and time.  HENT:  Head: Normocephalic and atraumatic.  Right Ear: External ear normal.  Left Ear: External ear normal.  Nose: Nose normal.  Mouth/Throat: Oropharynx is clear and moist.  Eyes: Conjunctivae are normal. Pupils are equal, round, and reactive to light.  Neck: Normal range of motion.  Cardiovascular: Normal rate, regular rhythm, normal heart sounds and intact distal pulses.  Pulmonary/Chest: Effort normal and breath sounds normal.  Abdominal: Soft. Bowel sounds are normal.  Musculoskeletal: Normal range of motion.  Neurological: He is alert and oriented to person, place, and time. He has normal reflexes.  Skin:            BP 110/66 (BP Location: Left Arm, Patient Position: Sitting, Cuff Size: Large)   Pulse 78   Temp 97.9 F (36.6 C) (Oral)   Resp 16   Ht 6\' 1"  (1.854 m)   Wt 203 lb (92.1 kg)   SpO2 97%   BMI 26.78 kg/m  Assessment & Plan:   Abscess of groin, right Abscess improved on Bactrim 800-160 mg every 12 hours for 10 days.  No signs of infection is visualized. Nondraining and nontender to palpation.  Tobacco dependence Smoking cessation instruction/counseling given:  counseled patient on the dangers of tobacco use, advised patient to stop smoking, and reviewed strategies to maximize success    Nolon NationsLaChina Moore Lyrical Sowle  MSN, FNP-C Patient Care Center [facility] [address]  [place], [place] [id number] [phone]

## 2017-03-28 ENCOUNTER — Ambulatory Visit: Payer: Medicare Other | Admitting: Family Medicine

## 2017-05-11 ENCOUNTER — Encounter: Payer: Self-pay | Admitting: Family Medicine

## 2017-05-11 ENCOUNTER — Ambulatory Visit (INDEPENDENT_AMBULATORY_CARE_PROVIDER_SITE_OTHER): Payer: Medicare Other | Admitting: Family Medicine

## 2017-05-11 VITALS — BP 112/68 | HR 73 | Temp 98.1°F | Resp 16 | Ht 73.0 in | Wt 197.0 lb

## 2017-05-11 DIAGNOSIS — L0293 Carbuncle, unspecified: Secondary | ICD-10-CM | POA: Diagnosis not present

## 2017-05-11 DIAGNOSIS — F17200 Nicotine dependence, unspecified, uncomplicated: Secondary | ICD-10-CM | POA: Diagnosis not present

## 2017-05-11 MED ORDER — DOXYCYCLINE HYCLATE 100 MG PO TABS: 100.0000 mg | ORAL_TABLET | Freq: Two times a day (BID) | ORAL | 1 refills | Status: DC

## 2017-05-11 NOTE — Patient Instructions (Signed)
  Will start a trial of Doxycycline 100 mg BID for 10 days. Continue to apply warm, moist compresses to lower back as needed. Wash with dial antibacterial soap twice daily.  Skin Abscess A skin abscess is an infected area on or under your skin that contains pus and other material. An abscess can happen almost anywhere on your body. Some abscesses break open (rupture) on their own. Most continue to get worse unless they are treated. The infection can spread deeper into the body and into your blood, which can make you feel sick. Treatment usually involves draining the abscess. Follow these instructions at home: Abscess Care  If you have an abscess that has not drained, place a warm, clean, wet washcloth over the abscess several times a day. Do this as told by your doctor.  Follow instructions from your doctor about how to take care of your abscess. Make sure you: ? Cover the abscess with a bandage (dressing). ? Change your bandage or gauze as told by your doctor. ? Wash your hands with soap and water before you change the bandage or gauze. If you cannot use soap and water, use hand sanitizer.  Check your abscess every day for signs that the infection is getting worse. Check for: ? More redness, swelling, or pain. ? More fluid or blood. ? Warmth. ? More pus or a bad smell. Medicines   Take over-the-counter and prescription medicines only as told by your doctor.  If you were prescribed an antibiotic medicine, take it as told by your doctor. Do not stop taking the antibiotic even if you start to feel better. General instructions  To avoid spreading the infection: ? Do not share personal care items, towels, or hot tubs with others. ? Avoid making skin-to-skin contact with other people.  Keep all follow-up visits as told by your doctor. This is important. Contact a doctor if:  You have more redness, swelling, or pain around your abscess.  You have more fluid or blood coming from your  abscess.  Your abscess feels warm when you touch it.  You have more pus or a bad smell coming from your abscess.  You have a fever.  Your muscles ache.  You have chills.  You feel sick. Get help right away if:  You have very bad (severe) pain.  You see red streaks on your skin spreading away from the abscess. This information is not intended to replace advice given to you by your health care provider. Make sure you discuss any questions you have with your health care provider. Document Released: 08/25/2007 Document Revised: 11/02/2015 Document Reviewed: 01/15/2015 Elsevier Interactive Patient Education  [employer]2018 Else[employer] Inc.

## 2017-05-11 NOTE — Progress Notes (Signed)
Subjective:    Patient ID: James Wright, male    DOB: 11/16/1965, 52 y.o.   MRN: [id number]  HPI Elayne GuerinWilliam Poznanski, a very pleasant 52 year old male presents complaining of an abscess to midback.  Mr. Laural BenesJohnson is complaining of recurrent boils and abscesses. He was treated for a boil to left buttocks and right groin 2 months ago.  Abscess has been present for 5 days and has been worsening. Current pain intensity is 2/10 described as tender and non draining. He has been taking Ibuprofen without sustained relief. The patient does not have type 2 diabetes mellitus.   Mr. Laural BenesJohnson is a chronic every day smoker. He has been smoking 0.5 ppd over the past 30 years.      Past Medical History:  Diagnosis Date  . Blindness of left eye    Social History   Socioeconomic History  . Marital status: Divorced    Spouse name: Not on file  . Number of children: Not on file  . Years of education: Not on file  . Highest education level: Not on file  Social Needs  . Financial resource strain: Not on file  . Food insecurity - worry: Not on file  . Food insecurity - inability: Not on file  . Transportation needs - medical: Not on file  . Transportation needs - non-medical: Not on file  Occupational History  . Not on file  Tobacco Use  . Smoking status: Current Every Day Smoker    Packs/day: 1.00    Types: Cigarettes  . Smokeless tobacco: Never Used  Substance and Sexual Activity  . Alcohol use: Yes    Comment: occ  . Drug use: Yes    Types: Marijuana    Comment: daily  . Sexual activity: Not on file  Other Topics Concern  . Not on file  Social History Narrative  . Not on file   Review of Systems  Constitutional: Negative.  Negative for fatigue.  HENT: Negative.   Respiratory: Negative.   Cardiovascular: Negative.   Gastrointestinal: Negative.   Endocrine: Negative for polydipsia, polyphagia and polyuria.  Genitourinary: Negative.   Musculoskeletal: Negative.  Negative for  arthralgias and back pain.  Skin: Positive for wound (abscess of right groin).  Allergic/Immunologic: Negative.  Negative for immunocompromised state.  Neurological: Negative.   Psychiatric/Behavioral: Negative.        Objective:   Physical Exam  Constitutional: He is oriented to person, place, and time.  HENT:  Head: Normocephalic and atraumatic.  Right Ear: External ear normal.  Left Ear: External ear normal.  Nose: Nose normal.  Mouth/Throat: Oropharynx is clear and moist.  Eyes: Conjunctivae are normal. Pupils are equal, round, and reactive to light.  Neck: Normal range of motion.  Cardiovascular: Normal rate, regular rhythm, normal heart sounds and intact distal pulses.  Pulmonary/Chest: Effort normal and breath sounds normal.  Abdominal: Soft. Bowel sounds are normal.  Musculoskeletal: Normal range of motion.  Neurological: He is alert and oriented to person, place, and time. He has normal reflexes.  Skin:            BP 112/68 (BP Location: Left Arm, Patient Position: Sitting, Cuff Size: Large)   Pulse 73   Temp 98.1 F (36.7 C) (Oral)   Resp 16   Ht 6\' 1"  (1.854 m)   Wt 197 lb (89.4 kg)   SpO2 98%   BMI 25.99 kg/m  Assessment & Plan:   Recurrent boils Will start a trial of doxycycline  100 mg BID Continue to apply warm, moist compresses as needed  Wash with Dial antibacterial soap  - doxycycline (VIBRA-TABS) 100 MG tablet; Take 1 tablet (100 mg total) by mouth 2 (two) times daily.  Dispense: 20 tablet; Refill: 1  Tobacco dependence Smoking cessation instruction/counseling given:  counseled patient on the dangers of tobacco use, advised patient to stop smoking, and reviewed strategies to maximize success    RTC: As needed  Nolon Nations  MSN, FNP-C Patient Care Center [facility] [address] [place], [place] [address] [phone]    The patient was given clear instructions to go to ER or return to medical center if  symptoms do not improve, worsen or new problems develop. The patient verbalized understanding.

## 2017-08-17 ENCOUNTER — Encounter: Payer: Self-pay | Admitting: Family Medicine

## 2017-08-17 ENCOUNTER — Ambulatory Visit (INDEPENDENT_AMBULATORY_CARE_PROVIDER_SITE_OTHER): Payer: Medicare Other | Admitting: Family Medicine

## 2017-08-17 VITALS — BP 131/78 | HR 79 | Temp 98.7°F | Resp 16 | Ht 73.0 in | Wt 186.0 lb

## 2017-08-17 DIAGNOSIS — L0293 Carbuncle, unspecified: Secondary | ICD-10-CM | POA: Diagnosis not present

## 2017-08-17 DIAGNOSIS — F17200 Nicotine dependence, unspecified, uncomplicated: Secondary | ICD-10-CM

## 2017-08-17 DIAGNOSIS — L732 Hidradenitis suppurativa: Secondary | ICD-10-CM

## 2017-08-17 MED ORDER — ACETAMINOPHEN-CODEINE 300-30 MG PO TABS: 1.0000 | ORAL_TABLET | Freq: Three times a day (TID) | ORAL | 0 refills | Status: DC | PRN

## 2017-08-17 MED ORDER — SULFAMETHOXAZOLE-TRIMETHOPRIM 800-160 MG PO TABS: 1.0000 | ORAL_TABLET | Freq: Two times a day (BID) | ORAL | 0 refills | Status: DC

## 2017-08-17 MED ORDER — DOXYCYCLINE HYCLATE 100 MG PO TABS: 100.0000 mg | ORAL_TABLET | Freq: Two times a day (BID) | ORAL | 3 refills | Status: DC

## 2017-08-17 MED ORDER — HIBICLENS HAND PUMP 32OZ MISC: 1.0000 | Freq: Every day | 5 refills | Status: DC | PRN

## 2017-08-17 NOTE — Patient Instructions (Addendum)
For recurrent boils or hydradenitis suppurativa will start Bactrim 800-160 mg every 12 hours for 10 days.  After completing Bactrim, will start doxycycline 100 mg twice daily prophylactically and return for follow-up in 3 months.  For pain associated with left abdominal boil, Tylenol 3 every 8 hours for moderate to severe pain.  Refrain from drinking alcohol, driving, or operating machinery while taking this medication.  Hidradenitis Suppurativa Hidradenitis suppurativa is a long-term (chronic) skin disease that starts with blocked sweat glands or hair follicles. Bacteria may grow in these blocked openings of your skin. Hidradenitis suppurativa is like a severe form of acne that develops in areas of your body where acne would be unusual. It is most likely to affect the areas of your body where skin rubs against skin and becomes moist. This includes your:  Underarms.  Groin.  Genital areas.  Buttocks.  Upper thighs.  Breasts.  Hidradenitis suppurativa may start out with small pimples. The pimples can develop into deep sores that break open (rupture) and drain pus. Over time your skin may thicken and become scarred. Hidradenitis suppurativa cannot be passed from person to person. What are the causes? The exact cause of hidradenitis suppurativa is not known. This condition may be due to:  Male and male hormones. The condition is rare before and after puberty.  An overactive body defense system (immune system). Your immune system may overreact to the blocked hair follicles or sweat glands and cause swelling and pus-filled sores.  What increases the risk? You may have a higher risk of hidradenitis suppurativa if you:  Are a woman.  Are between ages 14 and 64.  Have a family history of hidradenitis suppurativa.  Have a personal history of acne.  Are overweight.  Smoke.  Take the drug lithium.  What are the signs or symptoms? The first signs of an outbreak are usually painful  skin bumps that look like pimples. As the condition progresses:  Skin bumps may get bigger and grow deeper into the skin.  Bumps under the skin may rupture and drain smelly pus.  Skin may become itchy and infected.  Skin may thicken and scar.  Drainage may continue through tunnels under the skin (fistulas).  Walking and moving your arms can become painful.  How is this diagnosed? Your health care provider may diagnose hidradenitis suppurativa based on your medical history and your signs and symptoms. A physical exam will also be done. You may need to see a health care provider who specializes in skin diseases (dermatologist). You may also have tests done to confirm the diagnosis. These can include:  Swabbing a sample of pus or drainage from your skin so it can be sent to the lab and tested for infection.  Blood tests to check for infection.  How is this treated? The same treatment will not work for everybody with hidradenitis suppurativa. Your treatment will depend on how severe your symptoms are. You may need to try several treatments to find what works best for you. Part of your treatment may include cleaning and bandaging (dressing) your wounds. You may also have to take medicines, such as the following:  Antibiotics.  Acne medicines.  Medicines to block or suppress the immune system.  A diabetes medicine (metformin) is sometimes used to treat this condition.  For women, birth control pills can sometimes help relieve symptoms.  You may need surgery if you have a severe case of hidradenitis suppurativa that does not respond to medicine. Surgery may involve:  Using a laser to clear the skin and remove hair follicles.  Opening and draining deep sores.  Removing the areas of skin that are diseased and scarred.  Follow these instructions at home:  Learn as much as you can about your disease, and work closely with your health care providers.  Take medicines only as directed  by your health care provider.  If you were prescribed an antibiotic medicine, finish it all even if you start to feel better.  If you are overweight, losing weight may be very helpful. Try to reach and maintain a healthy weight.  Do not use any tobacco products, including cigarettes, chewing tobacco, or electronic cigarettes. If you need help quitting, ask your health care provider.  Do not shave the areas where you get hidradenitis suppurativa.  Do not wear deodorant.  Wear loose-fitting clothes.  Try not to overheat and get sweaty.  Take a daily bleach bath as directed by your health care provider. ? Fill your bathtub halfway with water. ? Pour in  cup of unscented household bleach. ? Soak for 5-10 minutes.  Cover sore areas with a warm, clean washcloth (compress) for 5-10 minutes. Contact a health care provider if:  You have a flare-up of hidradenitis suppurativa.  You have chills or a fever.  You are having trouble controlling your symptoms at home. This information is not intended to replace advice given to you by your health care provider. Make sure you discuss any questions you have with your health care provider. Document Released: 10/21/2003 Document Revised: 08/14/2015 Document Reviewed: 06/08/2013 Elsevier Interactive Patient Education  2018 [employer].

## 2017-08-17 NOTE — Progress Notes (Signed)
Subjective:    Patient ID: James Wright, male    DOB: February 18, 1966, 52 y.o.   MRN: [id number]  HPI  James Wright, a very pleasant 52 year old male presents complaining of Mr. Leavitt is complaining of multiple recurrent boils primarily to nose, inner thighs and left abdomen.  Patient characterizes boils as tender to touch.  Patient has been treated on multiple occasions for abscess.  He was last treated with doxycycline 5 months ago.  He states that abscesses have been present for greater than a week.  Current pain intensity is 5-6/10.  He has been taking Ibuprofen without sustained relief.   Mr. Deeb is a chronic every day smoker. He has been smoking 0.5 ppd over the past 30 years.      Past Medical History:  Diagnosis Date  . Blindness of left eye    Social History   Socioeconomic History  . Marital status: Divorced    Spouse name: Not on file  . Number of children: Not on file  . Years of education: Not on file  . Highest education level: Not on file  Occupational History  . Not on file  Social Needs  . Financial resource strain: Not on file  . Food insecurity:    Worry: Not on file    Inability: Not on file  . Transportation needs:    Medical: Not on file    Non-medical: Not on file  Tobacco Use  . Smoking status: Current Every Day Smoker    Packs/day: 1.00    Types: Cigarettes  . Smokeless tobacco: Never Used  Substance and Sexual Activity  . Alcohol use: Yes    Comment: occ  . Drug use: Yes    Types: Marijuana    Comment: daily  . Sexual activity: Not on file  Lifestyle  . Physical activity:    Days per week: Not on file    Minutes per session: Not on file  . Stress: Not on file  Relationships  . Social connections:    Talks on phone: Not on file    Gets together: Not on file    Attends religious service: Not on file    Active member of club or organization: Not on file    Attends meetings of clubs or organizations: Not on file    Relationship  status: Not on file  . Intimate partner violence:    Fear of current or ex partner: Not on file    Emotionally abused: Not on file    Physically abused: Not on file    Forced sexual activity: Not on file  Other Topics Concern  . Not on file  Social History Narrative  . Not on file   Review of Systems  Constitutional: Negative.  Negative for fatigue.  HENT: Negative.   Respiratory: Negative.   Cardiovascular: Negative.   Gastrointestinal: Negative.   Endocrine: Negative for polydipsia, polyphagia and polyuria.  Genitourinary: Negative.   Musculoskeletal: Negative.  Negative for arthralgias and back pain.  Skin: Positive for wound (abscess of right groin).  Allergic/Immunologic: Negative.  Negative for immunocompromised state.  Neurological: Negative.   Psychiatric/Behavioral: Negative.        Objective:   Physical Exam  Constitutional: He is oriented to person, place, and time.  HENT:  Head: Normocephalic and atraumatic.  Right Ear: External ear normal.  Left Ear: External ear normal.  Nose: Nose normal.  Mouth/Throat: Oropharynx is clear and moist.  Eyes: Pupils are equal, round, and  reactive to light. Conjunctivae are normal.  Neck: Normal range of motion.  Cardiovascular: Normal rate, regular rhythm, normal heart sounds and intact distal pulses.  Pulmonary/Chest: Effort normal and breath sounds normal.  Abdominal: Soft. Bowel sounds are normal.  Musculoskeletal: Normal range of motion.  Neurological: He is alert and oriented to person, place, and time. He has normal reflexes.  Skin:     Abscess to left lower abdomen with minimal tan drainage, and induration         BP 131/78 (BP Location: Left Arm, Patient Position: Sitting, Cuff Size: Normal)   Pulse 79   Temp 98.7 F (37.1 C) (Oral)   Resp 16   Ht  (1.854 m)   Wt 186 lb (84.4 kg)   SpO2 98%   BMI 24.54 kg/m  Assessment & Plan:    Recurrent boils - sulfamethoxazole-trimethoprim (BACTRIM  DS,SEPTRA DS) 800-160 MG tablet; Take 1 tablet by mouth 2 (two) times daily.  Dispense: 20 tablet; Refill: 0 - Misc. Devices (HIBICLENS HAND PUMP 32OZ) MISC; 1 each by Does not apply route daily as needed.  Dispense: 1 each; Refill: 5 - WOUND CULTURE   Hidradenitis suppurativa We will treat prophylactically with doxycycline 100 mg twice daily and follow-up in 3 months. - doxycycline (VIBRA-ABS) 100 MG tablet; Take 1 tablet (100 mg total) by mouth 2 (two) times daily.  Dispense: 60 tablet; Refill: 3 - Acetaminophen-Codeine (TYLENOL/CODEINE #3) 300-30 MG tablet; Take 1 tablet by mouth every 8 (eight) hours as needed for pain.  Dispense: 15 tablet; Refill: 0 - CBC - WOUND CULTURE  Tobacco dependence Smoking cessation instruction/counseling given:  counseled patient on the dangers of tobacco use, advised patient to stop smoking, and reviewed strategies to maximize success    RTC: 3 months for hidradenitis suppurativa    The patient was given clear instructions to go to ER or return to medical center if symptoms do not improve, worsen or new problems develop. The patient verbalized understanding.     Nolon Nations  MSN, FNP-C Patient Care [facility] [address] [place], [place] [address] [phone]

## 2017-08-18 ENCOUNTER — Telehealth: Payer: Self-pay

## 2017-08-18 LAB — CBC
HEMATOCRIT: 47.2 % (ref 37.5–51.0)
Hemoglobin: 15.9 g/dL (ref 13.0–17.7)
MCH: 30.2 pg (ref 26.6–33.0)
MCHC: 33.7 g/dL (ref 31.5–35.7)
MCV: 90 fL (ref 79–97)
Platelets: 348 10*3/uL (ref 150–450)
RBC: 5.26 x10E6/uL (ref 4.14–5.80)
RDW: 14.5 % (ref 12.3–15.4)
WBC: 13.8 10*3/uL — ABNORMAL HIGH (ref 3.4–10.8)

## 2017-08-18 NOTE — Telephone Encounter (Signed)
CALLED AND SPOKE WITH PATIENT AND ADVISED THAT WHITE BLOOD CELL COUNT IS ELEVATED AND HE SHOULD CONTINUE ALL ANTIBIOTICS AS PRESCRIBED. PATIENT VERBALIZED UNDERSTANDING. THANKS!

## 2017-08-18 NOTE — Telephone Encounter (Signed)
-----   Message from Massie Maroon, [place] sent at 08/18/2017  5:52 AM EDT ----- Regarding: lab results Please inform patient that white blood cell count is elevated. Advise patient to take all antibiotics as prescribed. Follow up in office as scheduled.   Nolon Nations  MSN, FNP-C Patient Care [facility] [address] [place], [place] [address] [phone]

## 2017-08-20 LAB — WOUND CULTURE: ORGANISM ID, BACTERIA: NONE SEEN

## 2017-08-22 ENCOUNTER — Telehealth: Payer: Self-pay

## 2017-08-22 NOTE — Telephone Encounter (Signed)
-----   Message from Massie MaroonLachina M Hollis, [place] sent at 08/22/2017  5:17 AM EDT ----- Regarding: lab results Please inform patient that wound culture yielded methicillin resistant staph aureus (MRSA). Continue antibiotic regimen as prescribed. If reoccurrence continues, he may warrant referral to infectious disease. Suggest strict return precautions if infection reoccurs.    James NationsLachina Moore Hollis  MSN, FNP-C Patient Care [facility] [address]  [place], [place] [id number] [phone]

## 2017-08-22 NOTE — Telephone Encounter (Signed)
Called and spoke with patient, advised that culture shows MRSA and that he should continue antibiotic as prescribed. Advised that if reoccurrence continues that he may need a referral to infectious disease. Patient verbalized understanding. Thanks!

## 2017-11-17 ENCOUNTER — Ambulatory Visit (INDEPENDENT_AMBULATORY_CARE_PROVIDER_SITE_OTHER): Payer: Medicare Other | Admitting: Family Medicine

## 2017-11-17 ENCOUNTER — Encounter: Payer: Self-pay | Admitting: Family Medicine

## 2017-11-17 VITALS — BP 111/74 | HR 66 | Temp 98.0°F | Resp 16 | Ht 73.0 in | Wt 191.0 lb

## 2017-11-17 DIAGNOSIS — L732 Hidradenitis suppurativa: Secondary | ICD-10-CM

## 2017-11-17 DIAGNOSIS — F17200 Nicotine dependence, unspecified, uncomplicated: Secondary | ICD-10-CM | POA: Diagnosis not present

## 2017-11-17 MED ORDER — DOXYCYCLINE HYCLATE 100 MG PO TABS: 100.0000 mg | ORAL_TABLET | Freq: Two times a day (BID) | ORAL | 3 refills | Status: DC

## 2017-11-17 MED ORDER — BACITRACIN 500 UNIT/GM EX OINT: 1.0000 "application " | TOPICAL_OINTMENT | Freq: Two times a day (BID) | CUTANEOUS | 0 refills | Status: DC

## 2017-11-17 MED ORDER — CHLORHEXIDINE GLUCONATE 4 % EX LIQD: Freq: Every day | CUTANEOUS | 2 refills | Status: DC | PRN

## 2017-11-17 NOTE — Patient Instructions (Signed)
Hidradenitis Suppurativa Hidradenitis suppurativa is a long-term (chronic) skin disease that starts with blocked sweat glands or hair follicles. Bacteria may grow in these blocked openings of your skin. Hidradenitis suppurativa is like a severe form of acne that develops in areas of your body where acne would be unusual. It is most likely to affect the areas of your body where skin rubs against skin and becomes moist. This includes your:  Underarms.  Groin.  Genital areas.  Buttocks.  Upper thighs.  Breasts.  Hidradenitis suppurativa may start out with small pimples. The pimples can develop into deep sores that break open (rupture) and drain pus. Over time your skin may thicken and become scarred. Hidradenitis suppurativa cannot be passed from person to person. What are the causes? The exact cause of hidradenitis suppurativa is not known. This condition may be due to:  Male and male hormones. The condition is rare before and after puberty.  An overactive body defense system (immune system). Your immune system may overreact to the blocked hair follicles or sweat glands and cause swelling and pus-filled sores.  What increases the risk? You may have a higher risk of hidradenitis suppurativa if you:  Are a woman.  Are between ages 11 and 55.  Have a family history of hidradenitis suppurativa.  Have a personal history of acne.  Are overweight.  Smoke.  Take the drug lithium.  What are the signs or symptoms? The first signs of an outbreak are usually painful skin bumps that look like pimples. As the condition progresses:  Skin bumps may get bigger and grow deeper into the skin.  Bumps under the skin may rupture and drain smelly pus.  Skin may become itchy and infected.  Skin may thicken and scar.  Drainage may continue through tunnels under the skin (fistulas).  Walking and moving your arms can become painful.  How is this diagnosed? Your health care provider may  diagnose hidradenitis suppurativa based on your medical history and your signs and symptoms. A physical exam will also be done. You may need to see a health care provider who specializes in skin diseases (dermatologist). You may also have tests done to confirm the diagnosis. These can include:  Swabbing a sample of pus or drainage from your skin so it can be sent to the lab and tested for infection.  Blood tests to check for infection.  How is this treated? The same treatment will not work for everybody with hidradenitis suppurativa. Your treatment will depend on how severe your symptoms are. You may need to try several treatments to find what works best for you. Part of your treatment may include cleaning and bandaging (dressing) your wounds. You may also have to take medicines, such as the following:  Antibiotics.  Acne medicines.  Medicines to block or suppress the immune system.  A diabetes medicine (metformin) is sometimes used to treat this condition.  For women, birth control pills can sometimes help relieve symptoms.  You may need surgery if you have a severe case of hidradenitis suppurativa that does not respond to medicine. Surgery may involve:  Using a laser to clear the skin and remove hair follicles.  Opening and draining deep sores.  Removing the areas of skin that are diseased and scarred.  Follow these instructions at home:  Learn as much as you can about your disease, and work closely with your health care providers.  Take medicines only as directed by your health care provider.  If you were prescribed   an antibiotic medicine, finish it all even if you start to feel better.  If you are overweight, losing weight may be very helpful. Try to reach and maintain a healthy weight.  Do not use any tobacco products, including cigarettes, chewing tobacco, or electronic cigarettes. If you need help quitting, ask your health care provider.  Do not shave the areas where you  get hidradenitis suppurativa.  Do not wear deodorant.  Wear loose-fitting clothes.  Try not to overheat and get sweaty.  Take a daily bleach bath as directed by your health care provider. ? Fill your bathtub halfway with water. ? Pour in  cup of unscented household bleach. ? Soak for 5-10 minutes.  Cover sore areas with a warm, clean washcloth (compress) for 5-10 minutes. Contact a health care provider if:  You have a flare-up of hidradenitis suppurativa.  You have chills or a fever.  You are having trouble controlling your symptoms at home. This information is not intended to replace advice given to you by your health care provider. Make sure you discuss any questions you have with your health care provider. Document Released: 10/21/2003 Document Revised: 08/14/2015 Document Reviewed: 06/08/2013 Elsevier Interactive Patient Education  2018 Elsevier Inc.  

## 2017-11-17 NOTE — Progress Notes (Signed)
  [facility]   Progress Note: General Provider: Mike GipAndre Asir Bingley, FNP  SUBJECTIVE:   James Wright is a 52 y.o. male who  has a past medical history of Blindness of left eye.  Follow-up (for recurrent boils ) Patient presents today with several open lesions on the upper and lower extremities, back and trunk.  Review of Systems  Constitutional: Negative.   HENT: Negative.   Eyes: Negative.   Respiratory: Negative.   Cardiovascular: Negative.   Gastrointestinal: Negative.   Genitourinary: Negative.   Musculoskeletal: Negative.   Skin: Negative.        Several open lesions.   Neurological: Negative.   Psychiatric/Behavioral: Negative.      OBJECTIVE: BP 111/74 (BP Location: Left Arm, Patient Position: Sitting, Cuff Size: Normal)   Pulse 66   Temp 98 F (36.7 C) (Oral)   Resp 16   Ht 6\' 1"  (1.854 m)   Wt 191 lb (86.6 kg)   SpO2 98%   BMI 25.20 kg/m   Physical Exam  Constitutional: He is oriented to person, place, and time. He appears well-developed and well-nourished. No distress.  HENT:  Head: Normocephalic and atraumatic.  Eyes: Pupils are equal, round, and reactive to light. Conjunctivae and EOM are normal.  Neck: Normal range of motion.  Cardiovascular: Normal rate, regular rhythm, normal heart sounds and intact distal pulses.  Pulmonary/Chest: Effort normal and breath sounds normal. No respiratory distress.  Abdominal: Soft. Bowel sounds are normal. He exhibits no distension.  Musculoskeletal: Normal range of motion.  Neurological: He is alert and oriented to person, place, and time.  Skin: Skin is warm and dry. Lesion (several lesions with various levels of excoriation noted to the lower extremities. Mild erythema surrounding the lesions. ) noted.  Psychiatric: He has a normal mood and affect. His behavior is normal. Thought content normal.  Nursing note and vitals reviewed.   ASSESSMENT/PLAN:   1. Tobacco  dependence Smoking cessation instruction/counseling given:  counseled patient on the dangers of tobacco use, advised patient to stop smoking, and reviewed strategies to maximize success  2. Hidradenitis suppurativa Discussed not picking the lesions and hand washing.  - Comprehensive metabolic panel - CBC with Differential - chlorhexidine (HIBICLENS) 4 % external liquid; Apply topically daily as needed.  Dispense: 120 mL; Refill: 2 - doxycycline (VIBRA-TABS) 100 MG tablet; Take 1 tablet (100 mg total) by mouth 2 (two) times daily.  Dispense: 60 tablet; Refill: 3        The patient was given clear instructions to go to ER or return to medical center if symptoms do not improve, worsen or new problems develop. The patient verbalized understanding and agreed with plan of care.   [name], FNP-BC Patient Care [facility] [address]  [place], [place] [id number] [phone]     This note has been created with Dragon speech recognition software and smart phrase technology. Any transcriptional errors are unintentional.

## 2017-11-18 LAB — COMPREHENSIVE METABOLIC PANEL
ALT: 23 IU/L (ref 0–44)
AST: 14 IU/L (ref 0–40)
Albumin/Globulin Ratio: 1.6 (ref 1.2–2.2)
Albumin: 3.7 g/dL (ref 3.5–5.5)
Alkaline Phosphatase: 75 IU/L (ref 39–117)
BUN/Creatinine Ratio: 13 (ref 9–20)
BUN: 11 mg/dL (ref 6–24)
Bilirubin Total: <0.2 mg/dL (ref 0.0–1.2)
CO2: 21 mmol/L (ref 20–29)
Calcium: 9 mg/dL (ref 8.7–10.2)
Chloride: 103 mmol/L (ref 96–106)
Creatinine, Ser: 0.87 mg/dL (ref 0.76–1.27)
GFR calc Af Amer: 116 mL/min/{1.73_m2} (ref >59)
GFR calc non Af Amer: 101 mL/min/{1.73_m2} (ref >59)
Globulin, Total: 2.3 g/dL (ref 1.5–4.5)
Glucose: 93 mg/dL (ref 65–99)
Potassium: 4.1 mmol/L (ref 3.5–5.2)
Sodium: 141 mmol/L (ref 134–144)
Total Protein: 6 g/dL (ref 6.0–8.5)

## 2017-11-18 LAB — CBC WITH DIFFERENTIAL/PLATELET
Basophils Absolute: 0.1 10*3/uL (ref 0.0–0.2)
Basos: 1 %
EOS (ABSOLUTE): 0.5 10*3/uL — ABNORMAL HIGH (ref 0.0–0.4)
Eos: 4 %
Hematocrit: 46.3 % (ref 37.5–51.0)
Hemoglobin: 15.6 g/dL (ref 13.0–17.7)
Immature Grans (Abs): 0 10*3/uL (ref 0.0–0.1)
Immature Granulocytes: 0 %
Lymphocytes Absolute: 3.1 10*3/uL (ref 0.7–3.1)
Lymphs: 27 %
MCH: 30 pg (ref 26.6–33.0)
MCHC: 33.7 g/dL (ref 31.5–35.7)
MCV: 89 fL (ref 79–97)
Monocytes Absolute: 1.1 10*3/uL — ABNORMAL HIGH (ref 0.1–0.9)
Monocytes: 10 %
Neutrophils Absolute: 6.8 10*3/uL (ref 1.4–7.0)
Neutrophils: 58 %
Platelets: 318 10*3/uL (ref 150–450)
RBC: 5.2 x10E6/uL (ref 4.14–5.80)
RDW: 13.6 % (ref 12.3–15.4)
WBC: 11.7 10*3/uL — ABNORMAL HIGH (ref 3.4–10.8)

## 2018-02-20 ENCOUNTER — Encounter: Payer: Self-pay | Admitting: Family Medicine

## 2018-02-20 ENCOUNTER — Ambulatory Visit (INDEPENDENT_AMBULATORY_CARE_PROVIDER_SITE_OTHER): Payer: Medicare Other | Admitting: Family Medicine

## 2018-02-20 VITALS — BP 128/78 | HR 72 | Temp 98.2°F | Resp 16 | Ht 73.0 in | Wt 191.0 lb

## 2018-02-20 DIAGNOSIS — J0110 Acute frontal sinusitis, unspecified: Secondary | ICD-10-CM

## 2018-02-20 MED ORDER — AMOXICILLIN-POT CLAVULANATE 875-125 MG PO TABS: 1.0000 | ORAL_TABLET | Freq: Two times a day (BID) | ORAL | 0 refills | Status: AC

## 2018-02-20 MED ORDER — METHYLPREDNISOLONE 4 MG PO TBPK: ORAL_TABLET | ORAL | 0 refills | Status: DC

## 2018-02-20 MED ORDER — BENZONATATE 100 MG PO CAPS: 100.0000 mg | ORAL_CAPSULE | Freq: Two times a day (BID) | ORAL | 0 refills | Status: DC | PRN

## 2018-02-20 NOTE — Progress Notes (Signed)
Established Patient Office Visit  Subjective:  Patient ID: James Wright, male    DOB: 05/24/1965  Age: 52 y.o. MRN: [id number]  CC:  Chief Complaint  Patient presents with  . Follow-up    3 month follow up   . Cough    cough and congestion x 3 week     HPI James Wright presents for 3 month follow up. Patient states that he has "had the flu x 3 weeks". Patient states that his HS has improved and he is no longer taking doxy daily.    Past Medical History:  Diagnosis Date  . Blindness of left eye   . Hidradenitis     Past Surgical History:  Procedure Laterality Date  . BACK SURGERY      No family history on file.  Social History   Socioeconomic History  . Marital status: Divorced    Spouse name: Not on file  . Number of children: Not on file  . Years of education: Not on file  . Highest education level: Not on file  Occupational History  . Not on file  Social Needs  . Financial resource strain: Not on file  . Food insecurity:    Worry: Not on file    Inability: Not on file  . Transportation needs:    Medical: Not on file    Non-medical: Not on file  Tobacco Use  . Smoking status: Current Every Day Smoker    Packs/day: 1.00    Types: Cigarettes  . Smokeless tobacco: Never Used  Substance and Sexual Activity  . Alcohol use: Yes    Comment: occ  . Drug use: Yes    Types: Marijuana    Comment: daily  . Sexual activity: Not on file  Lifestyle  . Physical activity:    Days per week: Not on file    Minutes per session: Not on file  . Stress: Not on file  Relationships  . Social connections:    Talks on phone: Not on file    Gets together: Not on file    Attends religious service: Not on file    Active member of club or organization: Not on file    Attends meetings of clubs or organizations: Not on file    Relationship status: Not on file  . Intimate partner violence:    Fear of current or ex partner: Not on file    Emotionally abused: Not on  file    Physically abused: Not on file    Forced sexual activity: Not on file  Other Topics Concern  . Not on file  Social History Narrative  . Not on file    Outpatient Medications Prior to Visit  Medication Sig Dispense Refill  . chlorhexidine (HIBICLENS) 4 % external liquid Apply topically daily as needed. (Patient not taking: Reported on 02/20/2018) 120 mL 2  . bacitracin 500 UNIT/GM ointment Apply 1 application topically 2 (two) times daily. 15 g 0   No facility-administered medications prior to visit.     No Known Allergies  ROS Review of Systems  HENT: Positive for congestion, postnasal drip, sinus pressure and sinus pain.   Respiratory: Positive for cough and shortness of breath.   All other systems reviewed and are negative.     Objective:    Physical Exam  Constitutional: He is oriented to person, place, and time. He appears well-developed and well-nourished. No distress.  HENT:  Head: Normocephalic and atraumatic.  Nose: Right sinus  exhibits frontal sinus tenderness. Left sinus exhibits frontal sinus tenderness.  Mouth/Throat: Uvula is midline, oropharynx is clear and moist and mucous membranes are normal.  Eyes: Pupils are equal, round, and reactive to light. Conjunctivae and EOM are normal.  Neck: Normal range of motion.  Cardiovascular: Normal rate, regular rhythm and normal heart sounds.  Pulmonary/Chest: Effort normal and breath sounds normal. No respiratory distress. He has no wheezes.  Musculoskeletal: Normal range of motion.  Lymphadenopathy:    He has no cervical adenopathy.  Neurological: He is alert and oriented to person, place, and time.  Skin: Skin is warm and dry.  Psychiatric: He has a normal mood and affect. His behavior is normal. Judgment and thought content normal.  Nursing note and vitals reviewed.   BP 128/78 (BP Location: Left Arm, Patient Position: Sitting, Cuff Size: Normal)   Pulse 72   Temp 98.2 F (36.8 C) (Oral)   Resp 16    Ht 6\' 1"  (1.854 m)   Wt 191 lb (86.6 kg)   SpO2 97%   BMI 25.20 kg/m  Wt Readings from Last 3 Encounters:  02/20/18 191 lb (86.6 kg)  11/17/17 191 lb (86.6 kg)  08/17/17 186 lb (84.4 kg)     Health Maintenance Due  Topic Date Due  . COLONOSCOPY  08/21/2017    There are no preventive care reminders to display for this patient.  No results found for: TSH Lab Results  Component Value Date   WBC 11.7 (H) 11/17/2017   HGB 15.6 11/17/2017   HCT 46.3 11/17/2017   MCV 89 11/17/2017   PLT 318 11/17/2017   Lab Results  Component Value Date   NA 141 11/17/2017   K 4.1 11/17/2017   CO2 21 11/17/2017   GLUCOSE 93 11/17/2017   BUN 11 11/17/2017   CREATININE 0.87 11/17/2017   BILITOT <0.2 11/17/2017   ALKPHOS 75 11/17/2017   AST 14 11/17/2017   ALT 23 11/17/2017   PROT 6.0 11/17/2017   ALBUMIN 3.7 11/17/2017   CALCIUM 9.0 11/17/2017   ANIONGAP 7 10/05/2016   No results found for: CHOL No results found for: HDL No results found for: LDLCALC No results found for: TRIG No results found for: CHOLHDL Lab Results  Component Value Date   HGBA1C 5.7 12/10/2016      Assessment & Plan:   Problem List Items Addressed This Visit    None    Visit Diagnoses    Acute non-recurrent frontal sinusitis    -  Primary   Relevant Medications   amoxicillin-clavulanate (AUGMENTIN) 875-125 MG tablet   benzonatate (TESSALON) 100 MG capsule   methylPREDNISolone (MEDROL DOSEPAK) 4 MG TBPK tablet      Meds ordered this encounter  Medications  . amoxicillin-clavulanate (AUGMENTIN) 875-125 MG tablet    Sig: Take 1 tablet by mouth every 12 (twelve) hours for 7 days.    Dispense:  14 tablet    Refill:  0  . benzonatate (TESSALON) 100 MG capsule    Sig: Take 1 capsule (100 mg total) by mouth 2 (two) times daily as needed for cough.    Dispense:  20 capsule    Refill:  0  . methylPREDNISolone (MEDROL DOSEPAK) 4 MG TBPK tablet    Sig: Take as directed on pack    Dispense:  21 tablet     Refill:  0    Follow-up: Return in about 3 months (around 05/22/2018) for routine follow up.    Mike Gip, FNP

## 2018-02-20 NOTE — Patient Instructions (Signed)

## 2018-05-22 ENCOUNTER — Encounter: Payer: Self-pay | Admitting: Family Medicine

## 2018-05-22 ENCOUNTER — Ambulatory Visit (INDEPENDENT_AMBULATORY_CARE_PROVIDER_SITE_OTHER): Payer: Medicare Other | Admitting: Family Medicine

## 2018-05-22 ENCOUNTER — Other Ambulatory Visit: Payer: Self-pay

## 2018-05-22 VITALS — BP 110/64 | HR 74 | Temp 97.4°F | Resp 18 | Ht 74.0 in | Wt 198.6 lb

## 2018-05-22 DIAGNOSIS — L732 Hidradenitis suppurativa: Secondary | ICD-10-CM | POA: Diagnosis not present

## 2018-05-22 DIAGNOSIS — Z1211 Encounter for screening for malignant neoplasm of colon: Secondary | ICD-10-CM | POA: Diagnosis not present

## 2018-05-22 MED ORDER — DOXYCYCLINE HYCLATE 100 MG PO TABS: 100.0000 mg | ORAL_TABLET | Freq: Two times a day (BID) | ORAL | 3 refills | Status: AC

## 2018-05-22 NOTE — Progress Notes (Signed)
  [facility]   Progress Note: General Provider: Mike Gip, FNP  SUBJECTIVE:   James Wright is a 53 y.o. male who  has a past medical history of Blindness of left eye and Hidradenitis.. Patient presents today for Follow-up and Rash (antibiotics helped before)  Patient presents for follow up of HS. He states that he is inconsistent with taking antibiotics. No other problems or concerns at the present time.  Review of Systems  Constitutional: Negative.   HENT: Negative.   Eyes: Negative.   Respiratory: Negative.   Cardiovascular: Negative.   Gastrointestinal: Negative.   Genitourinary: Negative.   Musculoskeletal: Negative.   Skin: Positive for rash.  Neurological: Negative.   Psychiatric/Behavioral: Negative.      OBJECTIVE: BP 110/64 (BP Location: Left Arm, Patient Position: Sitting, Cuff Size: Normal)   Pulse 74   Temp (!) 97.4 F (36.3 C) (Oral)   Resp 18   Ht 6\' 2"  (1.88 m)   Wt 198 lb 9.6 oz (90.1 kg)   SpO2 97%   BMI 25.50 kg/m   Wt Readings from Last 3 Encounters:  05/22/18 198 lb 9.6 oz (90.1 kg)  02/20/18 191 lb (86.6 kg)  11/17/17 191 lb (86.6 kg)     Physical Exam Vitals signs and nursing note reviewed.  Constitutional:      General: He is not in acute distress.    Appearance: He is well-developed.  HENT:     Head: Normocephalic and atraumatic.  Eyes:     Conjunctiva/sclera: Conjunctivae normal.     Pupils: Pupils are equal, round, and reactive to light.  Neck:     Musculoskeletal: Normal range of motion.  Cardiovascular:     Rate and Rhythm: Normal rate and regular rhythm.     Heart sounds: Normal heart sounds.  Pulmonary:     Effort: Pulmonary effort is normal. No respiratory distress.     Breath sounds: Normal breath sounds.  Abdominal:     General: Bowel sounds are normal. There is no distension.     Palpations: Abdomen is soft.  Musculoskeletal: Normal range of motion.  Skin:  General: Skin is warm and dry.     Findings: Lesion (one healing lesion noted to the left pubic symphysis. ) present.  Neurological:     Mental Status: He is alert and oriented to person, place, and time.  Psychiatric:        Behavior: Behavior normal.        Thought Content: Thought content normal.     ASSESSMENT/PLAN:   1. Hidradenitis suppurativa Encourage the use of an antibiotic soap such as dial. Continue with antibiotics. He needs to take on a consistent basis for best results.  - doxycycline (VIBRA-TABS) 100 MG tablet; Take 1 tablet (100 mg total) by mouth 2 (two) times daily for 30 days.  Dispense: 60 tablet; Refill: 3  2. Colon cancer screening Order placed for screening.  - Cologuard   Return in about 3 months (around 08/22/2018) for f/u.    The patient was given clear instructions to go to ER or return to medical center if symptoms do not improve, worsen or new problems develop. The patient verbalized understanding and agreed with plan of care.   Ms. Freda Jackson. Riley Lam, FNP-BC Patient Care Center [facility] [address] [place], [place] [address] [phone]

## 2018-05-22 NOTE — Patient Instructions (Signed)
Hidradenitis Suppurativa  Hidradenitis suppurativa is a long-term (chronic) skin disease. It is similar to a severe form of acne, but it affects areas of the body where acne would be unusual, especially areas of the body where skin rubs against skin and becomes moist. These include:  · Underarms.  · Groin.  · Genital area.  · Buttocks.  · Upper thighs.  · Breasts.  Hidradenitis suppurativa may start out as small lumps or pimples caused by blocked sweat glands or hair follicles. Pimples may develop into deep sores that break open (rupture) and drain pus. Over time, affected areas of skin may thicken and become scarred. This condition is rare and does not spread from person to person (non-contagious).  What are the causes?  The exact cause of this condition is not known. It may be related to:  · Male and male hormones.  · An overactive disease-fighting system (immune system). The immune system may over-react to blocked hair follicles or sweat glands and cause swelling and pus-filled sores.  What increases the risk?  You are more likely to develop this condition if you:  · Are male.  · Are 11-55 years old.  · Have a family history of hidradenitis suppurativa.  · Have a personal history of acne.  · Are overweight.  · Smoke.  · Take the medicine lithium.  What are the signs or symptoms?  The first symptoms are usually painful bumps in the skin, similar to pimples. The condition may get worse over time (progress), or it may only cause mild symptoms. If the disease progresses, symptoms may include:  · Skin bumps getting bigger and growing deeper into the skin.  · Bumps rupturing and draining pus.  · Itchy, infected skin.  · Skin getting thicker and scarred.  · Tunnels under the skin (fistulas) where pus drains from a bump.  · Pain during daily activities, such as pain during walking if your groin area is affected.  · Emotional problems, such as stress or depression. This condition may affect your appearance and your  ability or willingness to wear certain clothes or do certain activities.  How is this diagnosed?  This condition is diagnosed by a health care provider who specializes in skin diseases (dermatologist). You may be diagnosed based on:  · Your symptoms and medical history.  · A physical exam.  · Testing a pus sample for infection.  · Blood tests.  How is this treated?  Your treatment will depend on how severe your symptoms are. The same treatment will not work for everybody with this condition. You may need to try several treatments to find what works best for you. Treatment may include:  · Cleaning and bandaging (dressing) your wounds as needed.  · Lifestyle changes, such as new skin care routines.  · Taking medicines, such as:  ? Antibiotics.  ? Acne medicines.  ? Medicines to reduce the activity of the immune system.  ? A diabetes medicine (metformin).  ? Birth control pills, for women.  ? Steroids to reduce swelling and pain.  · Working with a mental health care provider, if you experience emotional distress due to this condition.  If you have severe symptoms that do not get better with medicine, you may need surgery. Surgery may involve:  · Using a laser to clear the skin and remove hair follicles.  · Opening and draining deep sores.  · Removing the areas of skin that are diseased and scarred.  Follow these instructions at home:    Medicines    · Take over-the-counter and prescription medicines only as told by your health care provider.  · If you were prescribed an antibiotic medicine, take it as told by your health care provider. Do not stop taking the antibiotic even if your condition improves.  Skin care  · If you have open wounds, cover them with a clean dressing as told by your health care provider. Keep wounds clean by washing them gently with soap and water when you bathe.  · Do not shave the areas where you get hidradenitis suppurativa.  · Do not wear deodorant.  · Wear loose-fitting clothes.  · Try to avoid  getting overheated or sweaty. If you get sweaty or wet, change into clean, dry clothes as soon as you can.  · To help relieve pain and itchiness, cover sore areas with a warm, clean washcloth (warm compress) for 5-10 minutes as often as needed.  · If told by your health care provider, take a bleach bath twice a week:  ? Fill your bathtub halfway with water.  ? Pour in ½ cup of unscented household bleach.  ? Soak in the tub for 5-10 minutes.  ? Only soak from the neck down. Avoid water on your face and hair.  ? Shower to rinse off the bleach from your skin.  General instructions  · Learn as much as you can about your disease so that you have an active role in your treatment. Work closely with your health care provider to find treatments that work for you.  · If you are overweight, work with your health care provider to lose weight as recommended.  · Do not use any products that contain nicotine or tobacco, such as cigarettes and e-cigarettes. If you need help quitting, ask your health care provider.  · If you struggle with living with this condition, talk with your health care provider or work with a mental health care provider as recommended.  · Keep all follow-up visits as told by your health care provider. This is important.  Where to find more information  · Hidradenitis Suppurativa [employer].: https://www.hs-foundation.org/  Contact a health care provider if you have:  · A flare-up of hidradenitis suppurativa.  · A fever or chills.  · Trouble controlling your symptoms at home.  · Trouble doing your daily activities because of your symptoms.  · Trouble dealing with emotional problems related to your condition.  Summary  · Hidradenitis suppurativa is a long-term (chronic) skin disease. It is similar to a severe form of acne, but it affects areas of the body where acne would be unusual.  · The first symptoms are usually painful bumps in the skin, similar to pimples. The condition may get worse over time  (progress), or it may only cause mild symptoms.  · If you have open wounds, cover them with a clean dressing as told by your health care provider. Keep wounds clean by washing them gently with soap and water when you bathe.  · Besides skin care, treatment may include medicines, laser treatment, and surgery.  This information is not intended to replace advice given to you by your health care provider. Make sure you discuss any questions you have with your health care provider.  Document Released: 10/21/2003 Document Revised: 03/16/2017 Document Reviewed: 03/16/2017  [employer]vier Interactive Patient Education © 2019 [employer]

## 2018-08-24 ENCOUNTER — Telehealth: Payer: Self-pay

## 2018-08-24 NOTE — Telephone Encounter (Signed)
Called to do COVID Screening for appointment tomorrow. No answer. Left a message to call back. Thanks! 

## 2018-08-25 ENCOUNTER — Ambulatory Visit: Payer: Medicare Other | Admitting: Family Medicine

## 2018-10-25 ENCOUNTER — Ambulatory Visit: Payer: Medicare Other | Admitting: Family Medicine

## 2018-11-29 ENCOUNTER — Encounter (HOSPITAL_COMMUNITY): Payer: Self-pay

## 2018-11-29 ENCOUNTER — Encounter (HOSPITAL_COMMUNITY): Payer: Self-pay | Admitting: *Deleted

## 2022-11-16 ENCOUNTER — Ambulatory Visit: Payer: Self-pay | Admitting: Nurse Practitioner

## 2022-12-21 ENCOUNTER — Ambulatory Visit: Payer: Self-pay | Admitting: Nurse Practitioner

## 2022-12-24 ENCOUNTER — Ambulatory Visit (INDEPENDENT_AMBULATORY_CARE_PROVIDER_SITE_OTHER): Payer: Medicare (Managed Care) | Admitting: Nurse Practitioner

## 2022-12-24 ENCOUNTER — Encounter: Payer: Self-pay | Admitting: Nurse Practitioner

## 2022-12-24 VITALS — BP 114/78 | HR 105 | Temp 97.0°F | Ht 74.0 in | Wt 216.0 lb

## 2022-12-24 DIAGNOSIS — J309 Allergic rhinitis, unspecified: Secondary | ICD-10-CM | POA: Insufficient documentation

## 2022-12-24 DIAGNOSIS — Z0000 Encounter for general adult medical examination without abnormal findings: Secondary | ICD-10-CM | POA: Diagnosis not present

## 2022-12-24 DIAGNOSIS — B350 Tinea barbae and tinea capitis: Secondary | ICD-10-CM | POA: Diagnosis not present

## 2022-12-24 DIAGNOSIS — F17200 Nicotine dependence, unspecified, uncomplicated: Secondary | ICD-10-CM

## 2022-12-24 DIAGNOSIS — F1290 Cannabis use, unspecified, uncomplicated: Secondary | ICD-10-CM

## 2022-12-24 HISTORY — DX: Cannabis use, unspecified, uncomplicated: F12.90

## 2022-12-24 MED ORDER — BENZONATATE 100 MG PO CAPS: 100.0000 mg | ORAL_CAPSULE | Freq: Three times a day (TID) | ORAL | 0 refills | Status: AC | PRN

## 2022-12-24 MED ORDER — FLUTICASONE PROPIONATE 50 MCG/ACT NA SUSP: 2.0000 | Freq: Every day | NASAL | 6 refills | Status: DC

## 2022-12-24 MED ORDER — TERBINAFINE HCL 250 MG PO TABS: 250.0000 mg | ORAL_TABLET | Freq: Every day | ORAL | 0 refills | Status: DC

## 2022-12-24 NOTE — Assessment & Plan Note (Addendum)
Smokes marijuana daily since age 57 Cessation encouraged

## 2022-12-24 NOTE — Patient Instructions (Addendum)
Please call the office if you develop any of these symptoms while taking terbinafine.  nausea, anorexia, fatigue, vomiting, right upper abdominal pain, jaundice, dark urine, pale stools.  1. Tobacco dependence   2. Marijuana smoker   3. Tinea capitis  - terbinafine (LAMISIL) 250 MG tablet; Take 1 tablet (250 mg total) by mouth daily.  Dispense: 30 tablet; Refill: 0 - Ambulatory referral to Dermatology

## 2022-12-24 NOTE — Assessment & Plan Note (Signed)
Smokes about 1 pack/day  Asked about quitting: confirms that he/she currently smokes cigarettes Advise to quit smoking: Educated about QUITTING to reduce the risk of cancer, cardio and cerebrovascular disease. Assess willingness: Unwilling to quit at this time, not working on cutting back. Assist with counseling and pharmacotherapy: Counseled for 5 minutes and literature provided. Arrange for follow up: follow up in 4 weeks and continue to offer help.

## 2022-12-24 NOTE — Assessment & Plan Note (Signed)
Start - fluticasone (FLONASE) 50 MCG/ACT nasal spray; Place 2 sprays into both nostrils daily.  Dispense: 16 g; Refill: 6 - benzonatate (TESSALON PERLES) 100 MG capsule; Take 1 capsule (100 mg total) by mouth 3 (three) times daily as needed for cough.  Dispense: 20 capsule; Refill: 0

## 2022-12-24 NOTE — Assessment & Plan Note (Signed)
  Start taking  terbinafine - terbinafine (LAMISIL) 250 MG tablet; Take 1 tablet (250 mg total) by mouth daily.  Dispense: 30 tablet; Refill: 0 - Ambulatory referral to Dermatology Checking CMP today, encouraged to report nausea, abdominal pain Follow-up in 4 weeks

## 2022-12-24 NOTE — Progress Notes (Signed)
New Patient Office Visit  Subjective:  Patient ID: James Wright, male    DOB: 1965/04/21  Age: 57 y.o. MRN: [id number]  CC:  Chief Complaint  Patient presents with   Establish Care    HPI James Wright is a 57 y.o. male  has a past medical history of Blindness of left eye and Hidradenitis.  Has no current PCP since 2020  Allergic rhinitis .patient presents with complaints of nonproductive cough stuffy nose, sometimes has wheezing he has been taking Mucinex as needed which helps some.  Currently denies fever, chills shortness of breath, wheezing., HA.  Stated that his parents had COVID about 2 weeks ago.  He does not take flu vaccine.   Current tobacco smoker.  Smokes 1 pack of cigarettes daily, started smoking at age 75.  Sometimes has wheezing, has had nonproductive cough since the past 2 weeks, he denies shortness of breath  Patient complains of a rash on his scalp since the past 6 months.  He denies fever, chills, itching.  He has not taken any medication for the rash     Past Medical History:  Diagnosis Date   Blindness of left eye    Hidradenitis     Past Surgical History:  Procedure Laterality Date   BACK SURGERY      Family History  Problem Relation Age of Onset   Colon cancer Neg Hx    Skin cancer Neg Hx     Social History   Socioeconomic History   Marital status: Divorced    Spouse name: Not on file   Number of children: 1   Years of education: Not on file   Highest education level: Not on file  Occupational History   Not on file  Tobacco Use   Smoking status: Every Day    Current packs/day: 1.00    Types: Cigarettes   Smokeless tobacco: Never  Vaping Use   Vaping status: Never Used  Substance and Sexual Activity   Alcohol use: Yes    Comment: occ   Drug use: Yes    Types: Marijuana    Comment: daily   Sexual activity: Yes    Birth control/protection: Condom  Other Topics Concern   Not on file  Social History Narrative   Lives  home alone    Social Determinants of Health   Financial Resource Strain: Not on file  Food Insecurity: Not on file  Transportation Needs: Not on file  Physical Activity: Not on file  Stress: Not on file  Social Connections: Not on file  Intimate Partner Violence: Not on file    ROS Review of Systems  Constitutional:  Negative for appetite change, chills, fatigue and fever.  HENT:  Positive for congestion and sneezing.   Respiratory:  Positive for cough. Negative for shortness of breath and wheezing.   Cardiovascular:  Negative for chest pain, palpitations and leg swelling.  Gastrointestinal:  Negative for abdominal pain, constipation, nausea and vomiting.  Genitourinary:  Negative for difficulty urinating, dysuria, flank pain and frequency.  Musculoskeletal:  Negative for arthralgias, back pain, joint swelling and myalgias.  Skin:  Positive for rash and wound. Negative for color change and pallor.  Neurological:  Negative for dizziness, facial asymmetry, weakness, numbness and headaches.  Psychiatric/Behavioral:  Negative for behavioral problems, confusion, self-injury and suicidal ideas.     Objective:   Today's Vitals: BP 114/78   Pulse (!) 105   Temp (!) 97 F (36.1 C)   Ht 6'  2" (1.88 m)   Wt 216 lb (98 kg)   SpO2 97%   BMI 27.73 kg/m   Physical Exam Vitals and nursing note reviewed.  Constitutional:      General: He is not in acute distress.    Appearance: Normal appearance. He is not ill-appearing, toxic-appearing or diaphoretic.  HENT:     Head:     Comments: An area on the right side of the scalp appears red and moist.  Some hair loss noted.     Nose: Congestion present.     Comments: Puffy eyes    Mouth/Throat:     Mouth: Mucous membranes are moist.     Pharynx: Oropharynx is clear. No oropharyngeal exudate or posterior oropharyngeal erythema.  Eyes:     General: No scleral icterus.       Right eye: No discharge.        Left eye: No discharge.      Extraocular Movements: Extraocular movements intact.     Conjunctiva/sclera: Conjunctivae normal.  Cardiovascular:     Rate and Rhythm: Normal rate and regular rhythm.     Pulses: Normal pulses.     Heart sounds: Normal heart sounds. No murmur heard.    No friction rub. No gallop.  Pulmonary:     Effort: Pulmonary effort is normal. No respiratory distress.     Breath sounds: Normal breath sounds. No stridor. No wheezing, rhonchi or rales.  Chest:     Chest wall: No tenderness.  Abdominal:     General: There is no distension.     Palpations: Abdomen is soft.     Tenderness: There is no abdominal tenderness. There is no right CVA tenderness, left CVA tenderness or guarding.  Musculoskeletal:        General: No swelling, tenderness, deformity or signs of injury.     Right lower leg: No edema.     Left lower leg: No edema.  Skin:    General: Skin is warm and dry.     Capillary Refill: Capillary refill takes less than 2 seconds.     Coloration: Skin is not jaundiced or pale.     Findings: No bruising, erythema or lesion.  Neurological:     Mental Status: He is alert and oriented to person, place, and time.     Motor: No weakness.     Coordination: Coordination normal.     Gait: Gait normal.  Psychiatric:        Mood and Affect: Mood normal.        Behavior: Behavior normal.        Thought Content: Thought content normal.        Judgment: Judgment normal.     Assessment & Plan:   Problem List Items Addressed This Visit       Respiratory   Allergic rhinitis    Start - fluticasone (FLONASE) 50 MCG/ACT nasal spray; Place 2 sprays into both nostrils daily.  Dispense: 16 g; Refill: 6 - benzonatate (TESSALON PERLES) 100 MG capsule; Take 1 capsule (100 mg total) by mouth 3 (three) times daily as needed for cough.  Dispense: 20 capsule; Refill: 0       Relevant Medications   fluticasone (FLONASE) 50 MCG/ACT nasal spray   benzonatate (TESSALON PERLES) 100 MG capsule      Musculoskeletal and Integument   Tinea capitis     Start taking  terbinafine - terbinafine (LAMISIL) 250 MG tablet; Take 1 tablet (250 mg total) by mouth daily.  Dispense: 30 tablet; Refill: 0 - Ambulatory referral to Dermatology Checking CMP today, encouraged to report nausea, abdominal pain Follow-up in 4 weeks          Relevant Medications   terbinafine (LAMISIL) 250 MG tablet   Other Relevant Orders   Ambulatory referral to Dermatology     Other   Tobacco dependence - Primary    Smokes about 1 pack/day  Asked about quitting: confirms that he/she currently smokes cigarettes Advise to quit smoking: Educated about QUITTING to reduce the risk of cancer, cardio and cerebrovascular disease. Assess willingness: Unwilling to quit at this time, not working on cutting back. Assist with counseling and pharmacotherapy: Counseled for 5 minutes and literature provided. Arrange for follow up: follow up in 4 weeks and continue to offer help.       Marijuana smoker    Smokes marijuana daily since age 70 Cessation encouraged      Other Visit Diagnoses     Health care maintenance       Relevant Orders   CMP14+EGFR   CBC       Outpatient Encounter Medications as of 12/24/2022  Medication Sig   Ascorbic Acid (VITAMIN C PO) Take by mouth.   benzonatate (TESSALON PERLES) 100 MG capsule Take 1 capsule (100 mg total) by mouth 3 (three) times daily as needed for cough.   fluticasone (FLONASE) 50 MCG/ACT nasal spray Place 2 sprays into both nostrils daily.   Multiple Vitamin (MULTIVITAMIN) capsule Take 1 capsule by mouth daily.   terbinafine (LAMISIL) 250 MG tablet Take 1 tablet (250 mg total) by mouth daily.   [DISCONTINUED] benzonatate (TESSALON) 100 MG capsule Take 1 capsule (100 mg total) by mouth 2 (two) times daily as needed for cough. (Patient not taking: Reported on 05/22/2018)   [DISCONTINUED] methylPREDNISolone (MEDROL DOSEPAK) 4 MG TBPK tablet Take as directed on pack (Patient  not taking: Reported on 05/22/2018)   No facility-administered encounter medications on file as of 12/24/2022.    Follow-up: Return in about 4 weeks (around 01/21/2023) for CPE.   Donell Beers, FNP

## 2022-12-25 LAB — CBC
Hematocrit: 33.1 % — ABNORMAL LOW (ref 37.5–51.0)
Hemoglobin: 10 g/dL — ABNORMAL LOW (ref 13.0–17.7)
MCH: 27.5 pg (ref 26.6–33.0)
MCHC: 30.2 g/dL — ABNORMAL LOW (ref 31.5–35.7)
MCV: 91 fL (ref 79–97)
Platelets: 238 10*3/uL (ref 150–450)
RBC: 3.63 x10E6/uL — ABNORMAL LOW (ref 4.14–5.80)
RDW: 15.6 % — ABNORMAL HIGH (ref 11.6–15.4)
WBC: 4.9 10*3/uL (ref 3.4–10.8)

## 2022-12-25 LAB — CMP14+EGFR
ALT: 25 [IU]/L (ref 0–44)
AST: 17 [IU]/L (ref 0–40)
Albumin: 4.2 g/dL (ref 3.8–4.9)
Alkaline Phosphatase: 94 [IU]/L (ref 44–121)
BUN/Creatinine Ratio: 13 (ref 9–20)
BUN: 8 mg/dL (ref 6–24)
Bilirubin Total: 0.2 mg/dL (ref 0.0–1.2)
CO2: 26 mmol/L (ref 20–29)
Calcium: 9.3 mg/dL (ref 8.7–10.2)
Chloride: 103 mmol/L (ref 96–106)
Creatinine, Ser: 0.64 mg/dL — ABNORMAL LOW (ref 0.76–1.27)
Globulin, Total: 3 g/dL (ref 1.5–4.5)
Glucose: 120 mg/dL — ABNORMAL HIGH (ref 70–99)
Potassium: 4.5 mmol/L (ref 3.5–5.2)
Sodium: 141 mmol/L (ref 134–144)
Total Protein: 7.2 g/dL (ref 6.0–8.5)
eGFR: 112 mL/min/{1.73_m2} (ref >59)

## 2022-12-27 ENCOUNTER — Other Ambulatory Visit: Payer: Self-pay | Admitting: Nurse Practitioner

## 2022-12-27 DIAGNOSIS — R739 Hyperglycemia, unspecified: Secondary | ICD-10-CM

## 2022-12-27 DIAGNOSIS — D649 Anemia, unspecified: Secondary | ICD-10-CM

## 2023-01-18 LAB — IRON,TIBC AND FERRITIN PANEL
Ferritin: 220 ng/mL (ref 30–400)
Iron Saturation: 20 % (ref 15–55)
Iron: 65 ug/dL (ref 38–169)
Total Iron Binding Capacity: 320 ug/dL (ref 250–450)
UIBC: 255 ug/dL (ref 111–343)

## 2023-01-18 LAB — HEMOGLOBIN A1C
Est. average glucose Bld gHb Est-mCnc: 123 mg/dL
Hgb A1c MFr Bld: 5.9 % — ABNORMAL HIGH (ref 4.8–5.6)

## 2023-01-18 LAB — SPECIMEN STATUS REPORT

## 2023-01-21 ENCOUNTER — Ambulatory Visit: Payer: Self-pay | Admitting: Nurse Practitioner

## 2023-01-28 ENCOUNTER — Ambulatory Visit: Payer: Self-pay | Admitting: Nurse Practitioner

## 2023-05-05 ENCOUNTER — Ambulatory Visit: Payer: Medicare Other

## 2023-08-31 ENCOUNTER — Ambulatory Visit: Payer: Self-pay | Admitting: Nurse Practitioner

## 2023-12-22 ENCOUNTER — Ambulatory Visit: Payer: Self-pay

## 2023-12-22 NOTE — Telephone Encounter (Signed)
 FYI Only or Action Required?: FYI only for provider.  Patient was last seen in primary care on 12/24/2022 by Paseda, Folashade R, FNP.  Called Nurse Triage reporting Foot Swelling.  Symptoms began today.  Interventions attempted: Nothing.  Symptoms are: unchanged.  Triage Disposition: See PCP When Office is Open (Within 3 Days)  Patient/caregiver understands and will follow disposition?: No appointment available. Advised to go to urgent care      Copied from CRM [id number]. Topic: Clinical - Red Word Triage >> Dec 22, 2023  1:57 PM [name] wrote: Red Word that prompted transfer to Nurse Triage: L Foot is swelling - no pain, Patient is able to walk on the foot. Chest congestion.       Reason for Disposition  [1] MILD swelling of both ankles (i.e., pedal edema) AND [2] new-onset or getting worse  Answer Assessment - Initial Assessment Questions Patient's friend advised the patient should go to urgent care. She has insisted we schedule for the next available with his PCP as well for a follow up.     1. ONSET: When did the swelling start? (e.g., minutes, hours, days)     Today  2. LOCATION: What part of the leg is swollen?  Are both legs swollen or just one leg?     Right foot  3. SEVERITY: How bad is the swelling? (e.g., localized; mild, moderate, severe)     Just his foot  4. REDNESS: Is there redness or signs of infection?     No 5. PAIN: Is the swelling painful to touch? If Yes, ask: How painful is it?   (Scale 1-10; mild, moderate or severe)     No 6. FEVER: Do you have a fever? If Yes, ask: What is it, how was it measured, and when did it start?      No 7. CAUSE: What do you think is causing the leg swelling?     Unsure  8. MEDICAL HISTORY: Do you have a history of blood clots (e.g., DVT), cancer, heart failure, kidney disease, or liver failure?     No 9. RECURRENT SYMPTOM: Have you had leg swelling before? If Yes, ask: When was the last  time? What happened that time?     No 10. OTHER SYMPTOMS: Do you have any other symptoms? (e.g., chest pain, difficulty breathing)       Rash on arm, chest congestion  Protocols used: Leg Swelling and Edema-A-AH

## 2024-01-10 ENCOUNTER — Encounter: Payer: Self-pay | Admitting: Nurse Practitioner

## 2024-01-10 ENCOUNTER — Ambulatory Visit: Payer: Self-pay | Admitting: Nurse Practitioner

## 2024-01-10 VITALS — BP 137/75 | HR 100 | Wt 245.0 lb

## 2024-01-10 DIAGNOSIS — J209 Acute bronchitis, unspecified: Secondary | ICD-10-CM | POA: Diagnosis not present

## 2024-01-10 DIAGNOSIS — R600 Localized edema: Secondary | ICD-10-CM

## 2024-01-10 DIAGNOSIS — R062 Wheezing: Secondary | ICD-10-CM | POA: Diagnosis not present

## 2024-01-10 DIAGNOSIS — F17200 Nicotine dependence, unspecified, uncomplicated: Secondary | ICD-10-CM | POA: Diagnosis not present

## 2024-01-10 DIAGNOSIS — R7303 Prediabetes: Secondary | ICD-10-CM | POA: Diagnosis not present

## 2024-01-10 DIAGNOSIS — F17210 Nicotine dependence, cigarettes, uncomplicated: Secondary | ICD-10-CM

## 2024-01-10 DIAGNOSIS — J309 Allergic rhinitis, unspecified: Secondary | ICD-10-CM

## 2024-01-10 LAB — POCT GLYCOSYLATED HEMOGLOBIN (HGB A1C): Hemoglobin A1C: 6.3 % — AB (ref 4.0–5.6)

## 2024-01-10 MED ORDER — METHYLPREDNISOLONE 4 MG PO TBPK: ORAL_TABLET | ORAL | 0 refills | Status: DC

## 2024-01-10 MED ORDER — AZITHROMYCIN 250 MG PO TABS
ORAL_TABLET | ORAL | 0 refills | Status: AC
Start: 2024-01-10 — End: 2024-01-15

## 2024-01-10 MED ORDER — ALBUTEROL SULFATE HFA 108 (90 BASE) MCG/ACT IN AERS: 2.0000 | INHALATION_SPRAY | Freq: Four times a day (QID) | RESPIRATORY_TRACT | 1 refills | Status: DC | PRN

## 2024-01-10 NOTE — Progress Notes (Signed)
 Acute Office Visit  Subjective:     Patient ID: James Wright, male    DOB: 07-03-1965, 58 y.o.   MRN: [id number]  Chief Complaint  Patient presents with   Foot Swelling   Nasal Congestion    For two weeks     HPI Discussed the use of AI scribe software for clinical note transcription with the patient, who gave verbal consent to proceed.  History of Present Illness James Wright is a 58 year old male  has a past medical history of Blindness of left eye, Hidradenitis, Marijuana smoker (12/24/2022), and Tobacco use disorder, moderate, dependence (02/16/2017). who presents with swelling in his feet and chest congestion.  He has been experiencing swelling in his feet for the past couple of weeks without associated pain. He also experiences shortness of breath and wheezing, which have been present since the onset of his symptoms. No recent chest pain, but he recalls a single episode in the past. He has not undergone any recent diagnostic studies for these symptoms.  He has a dry cough for about a month, which he describes as 'weird sounding.' No sputum production. He reports feeling hot and cold and is unsure if he has had a fever. He has tried over-the-counter medication similar to Theraflu, which provided minimal relief.  He has a history of smoking, currently smoking one pack every couple of days, having started at age 68.  He was informed of prediabetes based on labs from the previous year, but he has not had recent follow-up labs to assess his current status. He was previously prescribed Flonase  nasal spray for allergies, which he used for a while. He is not currently taking any other medications.    Assessment & Plan     Review of Systems  Constitutional:  Negative for appetite change, chills, fatigue and fever.  HENT:  Positive for congestion. Negative for postnasal drip, rhinorrhea and sneezing.   Respiratory:  Positive for cough, shortness of breath and wheezing.    Cardiovascular:  Positive for leg swelling. Negative for chest pain and palpitations.  Gastrointestinal:  Negative for abdominal pain, constipation, nausea and vomiting.  Genitourinary:  Negative for difficulty urinating, dysuria, flank pain and frequency.  Musculoskeletal:  Negative for arthralgias, back pain, joint swelling and myalgias.  Skin:  Negative for color change, pallor, rash and wound.  Neurological:  Negative for dizziness, facial asymmetry, weakness, numbness and headaches.  Psychiatric/Behavioral:  Negative for behavioral problems, confusion, self-injury and suicidal ideas.         Objective:    BP 137/75   Pulse 100   Wt 245 lb (111.1 kg)   SpO2 98%   BMI 31.46 kg/m    Physical Exam Vitals and nursing note reviewed.  Constitutional:      General: He is not in acute distress.    Appearance: Normal appearance. He is obese. He is not ill-appearing, toxic-appearing or diaphoretic.  HENT:     Right Ear: Tympanic membrane, ear canal and external ear normal. There is no impacted cerumen.     Left Ear: Tympanic membrane, ear canal and external ear normal. There is no impacted cerumen.     Nose: Congestion present. No rhinorrhea.     Mouth/Throat:     Mouth: Mucous membranes are moist.     Pharynx: Oropharynx is clear. No oropharyngeal exudate or posterior oropharyngeal erythema.  Eyes:     General: No scleral icterus.       Right eye: No discharge.  Left eye: No discharge.     Extraocular Movements: Extraocular movements intact.     Conjunctiva/sclera: Conjunctivae normal.  Cardiovascular:     Rate and Rhythm: Normal rate and regular rhythm.     Pulses: Normal pulses.     Heart sounds: Normal heart sounds. No murmur heard.    No friction rub. No gallop.  Pulmonary:     Effort: Pulmonary effort is normal. No respiratory distress.     Breath sounds: Normal breath sounds. No stridor. No wheezing, rhonchi or rales.  Chest:     Chest wall: No tenderness.   Abdominal:     General: There is no distension.     Palpations: Abdomen is soft.     Tenderness: There is no abdominal tenderness. There is no right CVA tenderness, left CVA tenderness or guarding.  Musculoskeletal:        General: No swelling, tenderness, deformity or signs of injury.     Right lower leg: Edema present.     Left lower leg: Edema present.     Comments: +1 pitting edema bilaterally  Skin:    General: Skin is warm and dry.     Capillary Refill: Capillary refill takes 2 to 3 seconds.     Coloration: Skin is not jaundiced or pale.     Findings: No bruising, erythema or lesion.  Neurological:     Mental Status: He is alert and oriented to person, place, and time.     Motor: No weakness.     Gait: Gait normal.  Psychiatric:        Mood and Affect: Mood normal.        Behavior: Behavior normal.        Thought Content: Thought content normal.        Judgment: Judgment normal.     Results for orders placed or performed in visit on 01/10/24  POCT glycosylated hemoglobin (Hb A1C)  Result Value Ref Range   Hemoglobin A1C 6.3 (A) 4.0 - 5.6 %   HbA1c POC (<> result, manual entry)     HbA1c, POC (prediabetic range)     HbA1c, POC (controlled diabetic range)          Assessment & Plan:   Problem List Items Addressed This Visit       Respiratory   Allergic rhinitis   Nasal congestion. Uses Flonase  for allergies. - Reinstate Flonase  nasal spray, 2 sprays into both nostrils daily.       Acute bronchitis - Primary   Cough with chest congestion and wheezing Cough with chest congestion and wheezing for a month. Possible respiratory infection or exacerbation of chronic condition due to smoking. - Prescribe azithromycin and Medrol  Dosepak - Advise hydration with at least 64 ounces of water daily. - Recommend Tylenol  as needed for fever or body aches. Smoking cessation encouraged Contact the office if no improvement in symptoms after full completion of prednisone and  azithromycin       Relevant Medications   methylPREDNISolone  (MEDROL  DOSEPAK) 4 MG TBPK tablet   azithromycin (ZITHROMAX) 250 MG tablet     Other   Tobacco use disorder, moderate, dependence   Smokes about less than  one pack/day  Asked about quitting: confirms that he currently smokes cigarettes Advise to quit smoking: Educated about QUITTING to reduce the risk of cancer, cardio and cerebrovascular disease. Assess willingness: Unwilling to quit at this time, but is working on cutting back. Assist with counseling and pharmacotherapy: Counseled for 3 minutes and  literature provided. Arrange for follow up: follow up in 3 months and continue to offer help.       Wheezing   Albuterol inhaler 2 puffs every 6 hours as needed ordered Smoking cessation encouraged      Relevant Medications   albuterol (VENTOLIN HFA) 108 (90 Base) MCG/ACT inhaler   Prediabetes   Lab Results  Component Value Date   HGBA1C 6.3 (A) 01/10/2024  Avoid sugar sweets soda      Relevant Orders   POCT glycosylated hemoglobin (Hb A1C) (Completed)   Bilateral lower extremity edema    Swelling in feet for weeks. Possible kidney or heart issues. Declined labs today. - Advise to avoid salty foods. - Recommend wearing compression socks. - Advise elevation of legs. - Patient declined labs today ,plan comprehensive labs at follow-up to assess for kidney or heart issues.        Meds ordered this encounter  Medications   methylPREDNISolone  (MEDROL  DOSEPAK) 4 MG TBPK tablet    Sig: Take as instructed on the packaging and  with breakfast    Dispense:  1 each    Refill:  0   azithromycin (ZITHROMAX) 250 MG tablet    Sig: Take 2 tablets on day 1, then 1 tablet daily on days 2 through 5    Dispense:  6 tablet    Refill:  0   albuterol (VENTOLIN HFA) 108 (90 Base) MCG/ACT inhaler    Sig: Inhale 2 puffs into the lungs every 6 (six) hours as needed for wheezing or shortness of breath.    Dispense:  8 g     Refill:  1    Return in about 3 months (around 04/11/2024) for CPE.  Prarthana Parlin R Aleksi Brummet, FNP

## 2024-01-10 NOTE — Assessment & Plan Note (Addendum)
 Cough with chest congestion and wheezing Cough with chest congestion and wheezing for a month. Possible respiratory infection or exacerbation of chronic condition due to smoking. - Prescribe azithromycin and Medrol  Dosepak - Advise hydration with at least 64 ounces of water daily. - Recommend Tylenol  as needed for fever or body aches. Smoking cessation encouraged Contact the office if no improvement in symptoms after full completion of prednisone and azithromycin

## 2024-01-10 NOTE — Assessment & Plan Note (Signed)
 Lab Results  Component Value Date   HGBA1C 6.3 (A) 01/10/2024  Avoid sugar sweets soda

## 2024-01-10 NOTE — Patient Instructions (Addendum)
 For the swelling in your lower extremities, be sure to elevate your legs when able, mind the salt intake, stay physically active and consider wearing compression stockings.   1. Prediabetes (Primary)  - POCT glycosylated hemoglobin (Hb A1C)  2. Wheezing  - albuterol (VENTOLIN HFA) 108 (90 Base) MCG/ACT inhaler; Inhale 2 puffs into the lungs every 6 (six) hours as needed for wheezing or shortness of breath.  Dispense: 8 g; Refill: 1  3. Acute bronchitis, unspecified organism  - methylPREDNISolone  (MEDROL  DOSEPAK) 4 MG TBPK tablet; Take as instructed on the packaging and  with breakfast  Dispense: 1 each; Refill: 0 - azithromycin (ZITHROMAX) 250 MG tablet; Take 2 tablets on day 1, then 1 tablet daily on days 2 through 5  Dispense: 6 tablet; Refill: 0    It is important that you exercise regularly at least 30 minutes 5 times a week as tolerated  Think about what you will eat, plan ahead. Choose  clean, green, fresh or frozen over canned, processed or packaged foods which are more sugary, salty and fatty. 70 to 75% of food eaten should be vegetables and fruit. Three meals at set times with snacks allowed between meals, but they must be fruit or vegetables. Aim to eat over a 12 hour period , example 7 am to 7 pm, and STOP after  your last meal of the day. Drink water,generally about 64 ounces per day, no other drink is as healthy. Fruit juice is best enjoyed in a healthy way, by EATING the fruit.  Thanks for choosing [facility] we consider it a privelige to serve you.

## 2024-01-10 NOTE — Assessment & Plan Note (Signed)
 Albuterol inhaler 2 puffs every 6 hours as needed ordered Smoking cessation encouraged

## 2024-01-10 NOTE — Assessment & Plan Note (Signed)
  Swelling in feet for weeks. Possible kidney or heart issues. Declined labs today. - Advise to avoid salty foods. - Recommend wearing compression socks. - Advise elevation of legs. - Patient declined labs today ,plan comprehensive labs at follow-up to assess for kidney or heart issues.

## 2024-01-10 NOTE — Assessment & Plan Note (Signed)
 Smokes about less than  one pack/day  Asked about quitting: confirms that he currently smokes cigarettes Advise to quit smoking: Educated about QUITTING to reduce the risk of cancer, cardio and cerebrovascular disease. Assess willingness: Unwilling to quit at this time, but is working on cutting back. Assist with counseling and pharmacotherapy: Counseled for 3 minutes and literature provided. Arrange for follow up: follow up in 3 months and continue to offer help.

## 2024-01-10 NOTE — Assessment & Plan Note (Signed)
 Nasal congestion. Uses Flonase  for allergies. - Reinstate Flonase  nasal spray, 2 sprays into both nostrils daily.

## 2024-02-21 ENCOUNTER — Ambulatory Visit (HOSPITAL_COMMUNITY)
Admission: RE | Admit: 2024-02-21 | Discharge: 2024-02-21 | Disposition: A | Discharge status: Home / Self Care | Source: Ambulatory Visit | Attending: Family Medicine | Admitting: Family Medicine

## 2024-02-21 ENCOUNTER — Ambulatory Visit (HOSPITAL_COMMUNITY)

## 2024-02-21 ENCOUNTER — Encounter (HOSPITAL_COMMUNITY): Payer: Self-pay

## 2024-02-21 ENCOUNTER — Ambulatory Visit (INDEPENDENT_AMBULATORY_CARE_PROVIDER_SITE_OTHER)

## 2024-02-21 VITALS — BP 125/84 | HR 114 | Temp 97.5°F | Resp 19

## 2024-02-21 DIAGNOSIS — J441 Chronic obstructive pulmonary disease with (acute) exacerbation: Secondary | ICD-10-CM

## 2024-02-21 DIAGNOSIS — R609 Edema, unspecified: Secondary | ICD-10-CM

## 2024-02-21 DIAGNOSIS — R0602 Shortness of breath: Secondary | ICD-10-CM

## 2024-02-21 MED ORDER — ALBUTEROL SULFATE (2.5 MG/3ML) 0.083% IN NEBU
2.5000 mg | INHALATION_SOLUTION | Freq: Once | RESPIRATORY_TRACT | Status: AC
  Administered 2024-02-21: 2.5 mg via RESPIRATORY_TRACT

## 2024-02-21 MED ORDER — POTASSIUM CHLORIDE CRYS ER 10 MEQ PO TBCR: 20.0000 meq | EXTENDED_RELEASE_TABLET | Freq: Every day | ORAL | 0 refills | Status: DC

## 2024-02-21 MED ORDER — FUROSEMIDE 20 MG PO TABS: 20.0000 mg | ORAL_TABLET | Freq: Every day | ORAL | 0 refills | Status: DC

## 2024-02-21 MED ORDER — PREDNISONE 20 MG PO TABS: 40.0000 mg | ORAL_TABLET | Freq: Every day | ORAL | 0 refills | Status: AC

## 2024-02-21 MED ORDER — ALBUTEROL SULFATE HFA 108 (90 BASE) MCG/ACT IN AERS: 2.0000 | INHALATION_SPRAY | RESPIRATORY_TRACT | 0 refills | Status: DC | PRN

## 2024-02-21 MED ORDER — ALBUTEROL SULFATE (2.5 MG/3ML) 0.083% IN NEBU
INHALATION_SOLUTION | RESPIRATORY_TRACT | Status: AC
  Filled 2024-02-21: qty 3

## 2024-02-21 NOTE — ED Notes (Signed)
 Pt at nsg station states he has to leave. Nurse advice pt to stay and d/c will be soon. Pt refused to stay.

## 2024-02-21 NOTE — ED Triage Notes (Signed)
 Pt reports had cough for month, took Z pal for it weeks ago. Reports having very little phlegm that comes up now.  Reports pt reports 1-2 weeks having leg and face swelling.

## 2024-02-21 NOTE — ED Provider Notes (Addendum)
 [facility]    CSN: [id number] Arrival date & time: 02/21/24  1102      History   Chief Complaint Chief Complaint  Patient presents with   Cough   Leg Swelling    HPI James Wright is a 58 y.o. male.    Cough  Here for continued cough and some shortness of breath.  He mainly has dyspnea on exertion.  In late October he was prescribed a Z-Pak and 5 days of prednisone  for what seems to be a bronchitis.  He states that he did improve taking his medications but right after he finished then he started having more trouble again.  He states he did not receive the albuterol  inhaler prescription that was sent to the pharmacy.  He also has begun having some swelling in his legs and feet and face and his abdomen feels bloated.  He has maybe had some subjective fever off and on.  NKDA  Last eGFR in October 2024 was 112.  Electrolytes were also normal at that time.  Past Medical History:  Diagnosis Date   Blindness of left eye    Hidradenitis    Marijuana smoker 12/24/2022   Tobacco use disorder, moderate, dependence 02/16/2017    Patient Active Problem List   Diagnosis Date Noted   Acute bronchitis 01/10/2024   Wheezing 01/10/2024   Prediabetes 01/10/2024   Bilateral lower extremity edema 01/10/2024   Marijuana smoker 12/24/2022   Allergic rhinitis 12/24/2022   Tinea capitis 12/24/2022   Abscess of groin, right 02/16/2017   Tobacco use disorder, moderate, dependence 02/16/2017   DENTAL PAIN 01/18/2008   DEPRESSION 09/21/2007   INSOMNIA 09/21/2007   LUMBAR RADICULOPATHY 12/20/2005   HERNIATED LUMBAR DISC 02/25/2005    Past Surgical History:  Procedure Laterality Date   BACK SURGERY         Home Medications    Prior to Admission medications   Medication Sig Start Date End Date Taking? Authorizing Provider  albuterol  (VENTOLIN  HFA) 108 (90 Base) MCG/ACT inhaler Inhale 2 puffs into the lungs every 4 (four) hours as needed for wheezing or shortness  of breath. 02/21/24  Yes Vonna Sharlet POUR, MD  furosemide  (LASIX ) 20 MG tablet Take 1 tablet (20 mg total) by mouth daily for 3 days. 02/21/24 02/24/24 Yes Vonna Sharlet POUR, MD  potassium chloride  (KLOR-CON  M) 10 MEQ tablet Take 2 tablets (20 mEq total) by mouth daily for 3 days. 02/21/24 02/24/24 Yes Vonna Sharlet POUR, MD  predniSONE  (DELTASONE ) 20 MG tablet Take 2 tablets (40 mg total) by mouth daily with breakfast for 5 days. 02/21/24 02/26/24 Yes Cai Anfinson K, MD  Ascorbic Acid (VITAMIN C PO) Take by mouth.    [provider]  fluticasone  (FLONASE ) 50 MCG/ACT nasal spray Place 2 sprays into both nostrils daily. Patient not taking: Reported on 01/10/2024 12/24/22   Paseda, Folashade R, FNP  terbinafine  (LAMISIL ) 250 MG tablet Take 1 tablet (250 mg total) by mouth daily. Patient not taking: Reported on 01/10/2024 12/24/22   Paseda, Folashade R, FNP    Family History Family History  Problem Relation Age of Onset   Colon cancer Neg Hx    Skin cancer Neg Hx     Social History Social History   Tobacco Use   Smoking status: Every Day    Current packs/day: 1.00    Types: Cigarettes   Smokeless tobacco: Never  Vaping Use   Vaping status: Never Used  Substance Use Topics   Alcohol use:  Yes    Comment: occ   Drug use: Yes    Types: Marijuana    Comment: daily     Allergies   Patient has no known allergies.   Review of Systems Review of Systems  Respiratory:  Positive for cough.      Physical Exam Triage Vital Signs ED Triage Vitals  Encounter Vitals Group     BP 02/21/24 1127 125/84     Girls Systolic BP Percentile --      Girls Diastolic BP Percentile --      Boys Systolic BP Percentile --      Boys Diastolic BP Percentile --      Pulse Rate 02/21/24 1127 (!) 114     Resp 02/21/24 1127 19     Temp 02/21/24 1127 (!) 97.5 F (36.4 C)     Temp Source 02/21/24 1127 Oral     SpO2 02/21/24 1127 91 %     Weight --      Height --      Head Circumference --       Peak Flow --      Pain Score 02/21/24 1126 0     Pain Loc --      Pain Education --      Exclude from Growth Chart --    No data found.  Updated Vital Signs BP 125/84 (BP Location: Left Arm)   Pulse (!) 114   Temp (!) 97.5 F (36.4 C) (Oral)   Resp 19   SpO2 91%   Visual Acuity Right Eye Distance:   Left Eye Distance:   Bilateral Distance:    Right Eye Near:   Left Eye Near:    Bilateral Near:     Physical Exam Vitals reviewed.  Constitutional:      General: He is not in acute distress.    Appearance: He is not toxic-appearing.  HENT:     Nose: Nose normal.     Mouth/Throat:     Mouth: Mucous membranes are moist.     Pharynx: No oropharyngeal exudate or posterior oropharyngeal erythema.  Eyes:     Extraocular Movements: Extraocular movements intact.     Conjunctiva/sclera: Conjunctivae normal.     Pupils: Pupils are equal, round, and reactive to light.  Cardiovascular:     Rate and Rhythm: Normal rate and regular rhythm.     Heart sounds: No murmur heard. Pulmonary:     Breath sounds: No stridor. Wheezing (There are expiratory wheezes that are scattered throughout both lung fields.  They are mostly end expiratory and air movement is good.) present. No rhonchi or rales.  Musculoskeletal:     Cervical back: Neck supple.  Lymphadenopathy:     Cervical: No cervical adenopathy.  Skin:    Capillary Refill: Capillary refill takes less than 2 seconds.     Coloration: Skin is not jaundiced or pale.  Neurological:     General: No focal deficit present.     Mental Status: He is alert and oriented to person, place, and time.  Psychiatric:        Behavior: Behavior normal.      UC Treatments / Results  Labs (all labs ordered are listed, but only abnormal results are displayed) Labs Reviewed  BASIC METABOLIC PANEL WITH GFR  BRAIN NATRIURETIC PEPTIDE    EKG   Radiology DG Chest 2 View Result Date: 02/21/2024 CLINICAL DATA:  Dyspnea on exertion and  cough. EXAM: CHEST - 2 VIEW COMPARISON:  None  Available. FINDINGS: Mild cardiac enlargement. Mild pulmonary interstitial prominence may reflect chronic disease or minimal pulmonary interstitial edema. There is no evidence of overt airspace edema, consolidation, pneumothorax, nodule or pleural fluid. The visualized skeletal structures are unremarkable. IMPRESSION: Mild cardiac enlargement. Mild pulmonary interstitial prominence may reflect chronic disease or minimal pulmonary interstitial edema. Electronically Signed   By: Marcey Moan M.D.   On: 02/21/2024 13:53    Procedures Procedures (including critical care time)  Medications Ordered in UC Medications  albuterol  (PROVENTIL ) (2.5 MG/3ML) 0.083% nebulizer solution 2.5 mg (2.5 mg Nebulization Given 02/21/24 1204)    Initial Impression / Assessment and Plan / UC Course  I have reviewed the triage vital signs and the nursing notes.  Pertinent labs & imaging results that were available during my care of the patient were reviewed by me and considered in my medical decision making (see chart for details).     Chest x-ray shows some possible mild pulmonary edema.  There are not any prior films for comparison.  By my review I think there is more than just a little fluid on the chest x-ray.  After the albuterol  nebulization, he did get some good symptom relief and his lungs were clear after the nebulizer treatment.  I do still think that he is at least in part had an asthma exacerbation.  Prednisone and albuterol  are sent in for that.  Since there is possibly some pulmonary edema on the chest x-ray, we are going to blood work to check his BMP and BNP. 2 days supply of Lasix and potassium were sent in to help with the pulmonary edema.  I have asked him to follow-up with his primary care and he is to go to emergency room if he worsens in any way   Apparently follow-up was working on the patient's discharge papers, he walked out of the room and  told staff he was leaving and would not wait for his blood to be drawn.  I will leave discharge instructions as they are stated. Final Clinical Impressions(s) / UC Diagnoses   Final diagnoses:  Shortness of breath  COPD exacerbation (HCC)  Edema, unspecified type     Discharge Instructions      The chest x-ray does show a little bit of fluid in your lungs, but no pneumonia.  Albuterol  inhaler--do 2 puffs every 4 hours as needed for shortness of breath or wheezing  Take prednisone 20 mg--2 daily for 5 days  Furosemide 20 mg--1 daily for 3 days.  This is a diuretic to help with the swelling.  Potassium chloride 10 mEq-take 2 daily for 3 days.  The diuretic listed above can make you lose potassium, so it is important to take the potassium with it.  We have drawn blood to check electrolytes and kidney function and a marker for heart failure.  Staff will notify you if there is anything significantly abnormal   Please make an appointment with your primary care for sometime soon  Please go to the emergency room if you start feeling worse again.     ED Prescriptions     Medication Sig Dispense Auth. Provider   albuterol  (VENTOLIN  HFA) 108 (90 Base) MCG/ACT inhaler Inhale 2 puffs into the lungs every 4 (four) hours as needed for wheezing or shortness of breath. 1 each Vonna Sharlet POUR, MD   predniSONE (DELTASONE) 20 MG tablet Take 2 tablets (40 mg total) by mouth daily with breakfast for 5 days. 10 tablet Vonna Sharlet POUR, MD  furosemide (LASIX) 20 MG tablet Take 1 tablet (20 mg total) by mouth daily for 3 days. 30 tablet [place] Yarbro K, MD   potassium chloride (KLOR-CON M) 10 MEQ tablet Take 2 tablets (20 mEq total) by mouth daily for 3 days. 6 tablet Vonna Keaira Whitehurst K, MD      PDMP not reviewed this encounter.   Vonna Sharlet POUR, MD 02/21/24 1408    Vonna Sharlet POUR, MD 02/21/24 1409    Vonna Sharlet POUR, MD 02/21/24 1411    Vonna Sharlet POUR,  MD 02/21/24 [id number]

## 2024-02-21 NOTE — Discharge Instructions (Addendum)
 The chest x-ray does show a little bit of fluid in your lungs, but no pneumonia.  Albuterol  inhaler--do 2 puffs every 4 hours as needed for shortness of breath or wheezing  Take prednisone 20 mg--2 daily for 5 days  Furosemide 20 mg--1 daily for 3 days.  This is a diuretic to help with the swelling.  Potassium chloride 10 mEq-take 2 daily for 3 days.  The diuretic listed above can make you lose potassium, so it is important to take the potassium with it.  We have drawn blood to check electrolytes and kidney function and a marker for heart failure.  Staff will notify you if there is anything significantly abnormal   Please make an appointment with your primary care for sometime soon  Please go to the emergency room if you start feeling worse again.

## 2024-03-02 ENCOUNTER — Emergency Department (HOSPITAL_COMMUNITY)

## 2024-03-02 ENCOUNTER — Encounter (HOSPITAL_COMMUNITY): Payer: Self-pay

## 2024-03-02 ENCOUNTER — Other Ambulatory Visit: Payer: Self-pay

## 2024-03-02 ENCOUNTER — Inpatient Hospital Stay (HOSPITAL_COMMUNITY): Admission: EM | Admit: 2024-03-02 | Discharge: 2024-03-06 | DRG: 190

## 2024-03-02 DIAGNOSIS — R0902 Hypoxemia: Secondary | ICD-10-CM | POA: Diagnosis present

## 2024-03-02 DIAGNOSIS — F1490 Cocaine use, unspecified, uncomplicated: Secondary | ICD-10-CM | POA: Diagnosis not present

## 2024-03-02 DIAGNOSIS — F1410 Cocaine abuse, uncomplicated: Secondary | ICD-10-CM | POA: Diagnosis present

## 2024-03-02 DIAGNOSIS — E669 Obesity, unspecified: Secondary | ICD-10-CM | POA: Diagnosis present

## 2024-03-02 DIAGNOSIS — Z5329 Procedure and treatment not carried out because of patient's decision for other reasons: Secondary | ICD-10-CM | POA: Diagnosis present

## 2024-03-02 DIAGNOSIS — I071 Rheumatic tricuspid insufficiency: Secondary | ICD-10-CM | POA: Diagnosis present

## 2024-03-02 DIAGNOSIS — Z6832 Body mass index (BMI) 32.0-32.9, adult: Secondary | ICD-10-CM | POA: Diagnosis not present

## 2024-03-02 DIAGNOSIS — F1210 Cannabis abuse, uncomplicated: Secondary | ICD-10-CM | POA: Diagnosis present

## 2024-03-02 DIAGNOSIS — I429 Cardiomyopathy, unspecified: Secondary | ICD-10-CM | POA: Diagnosis not present

## 2024-03-02 DIAGNOSIS — J309 Allergic rhinitis, unspecified: Secondary | ICD-10-CM

## 2024-03-02 DIAGNOSIS — M7989 Other specified soft tissue disorders: Secondary | ICD-10-CM | POA: Diagnosis present

## 2024-03-02 DIAGNOSIS — H5462 Unqualified visual loss, left eye, normal vision right eye: Secondary | ICD-10-CM | POA: Diagnosis present

## 2024-03-02 DIAGNOSIS — Z1152 Encounter for screening for COVID-19: Secondary | ICD-10-CM | POA: Diagnosis not present

## 2024-03-02 DIAGNOSIS — J440 Chronic obstructive pulmonary disease with acute lower respiratory infection: Secondary | ICD-10-CM | POA: Diagnosis present

## 2024-03-02 DIAGNOSIS — I509 Heart failure, unspecified: Secondary | ICD-10-CM

## 2024-03-02 DIAGNOSIS — L732 Hidradenitis suppurativa: Secondary | ICD-10-CM | POA: Diagnosis present

## 2024-03-02 DIAGNOSIS — J441 Chronic obstructive pulmonary disease with (acute) exacerbation: Secondary | ICD-10-CM | POA: Diagnosis present

## 2024-03-02 DIAGNOSIS — Z720 Tobacco use: Secondary | ICD-10-CM | POA: Diagnosis not present

## 2024-03-02 DIAGNOSIS — R000 Tachycardia, unspecified: Secondary | ICD-10-CM | POA: Diagnosis present

## 2024-03-02 DIAGNOSIS — Z7984 Long term (current) use of oral hypoglycemic drugs: Secondary | ICD-10-CM | POA: Diagnosis not present

## 2024-03-02 DIAGNOSIS — Z8249 Family history of ischemic heart disease and other diseases of the circulatory system: Secondary | ICD-10-CM | POA: Diagnosis not present

## 2024-03-02 DIAGNOSIS — I5021 Acute systolic (congestive) heart failure: Secondary | ICD-10-CM | POA: Diagnosis present

## 2024-03-02 DIAGNOSIS — N179 Acute kidney failure, unspecified: Secondary | ICD-10-CM | POA: Diagnosis present

## 2024-03-02 DIAGNOSIS — F17210 Nicotine dependence, cigarettes, uncomplicated: Secondary | ICD-10-CM | POA: Diagnosis present

## 2024-03-02 DIAGNOSIS — E119 Type 2 diabetes mellitus without complications: Secondary | ICD-10-CM | POA: Diagnosis present

## 2024-03-02 DIAGNOSIS — R609 Edema, unspecified: Secondary | ICD-10-CM | POA: Diagnosis not present

## 2024-03-02 DIAGNOSIS — E8770 Fluid overload, unspecified: Secondary | ICD-10-CM | POA: Diagnosis not present

## 2024-03-02 DIAGNOSIS — N281 Cyst of kidney, acquired: Secondary | ICD-10-CM | POA: Diagnosis present

## 2024-03-02 LAB — TROPONIN I (HIGH SENSITIVITY)
Troponin I (High Sensitivity): 60 ng/L — ABNORMAL HIGH (ref <18)
Troponin I (High Sensitivity): 68 ng/L — ABNORMAL HIGH (ref <18)

## 2024-03-02 LAB — RESP PANEL BY RT-PCR (RSV, FLU A&B, COVID)  RVPGX2
Influenza A by PCR: NEGATIVE
Influenza B by PCR: NEGATIVE
Resp Syncytial Virus by PCR: NEGATIVE
SARS Coronavirus 2 by RT PCR: NEGATIVE

## 2024-03-02 LAB — BASIC METABOLIC PANEL WITH GFR
Anion gap: 10 (ref 5–15)
BUN: 17 mg/dL (ref 6–20)
CO2: 26 mmol/L (ref 22–32)
Calcium: 8.7 mg/dL — ABNORMAL LOW (ref 8.9–10.3)
Chloride: 104 mmol/L (ref 98–111)
Creatinine, Ser: 1.02 mg/dL (ref 0.61–1.24)
GFR, Estimated: >60 mL/min (ref >60)
Glucose, Bld: 148 mg/dL — ABNORMAL HIGH (ref 70–99)
Potassium: 4 mmol/L (ref 3.5–5.1)
Sodium: 140 mmol/L (ref 135–145)

## 2024-03-02 LAB — CBC
HCT: 48.1 % (ref 39.0–52.0)
Hemoglobin: 15.4 g/dL (ref 13.0–17.0)
MCH: 29.2 pg (ref 26.0–34.0)
MCHC: 32 g/dL (ref 30.0–36.0)
MCV: 91.1 fL (ref 80.0–100.0)
Platelets: 298 K/uL (ref 150–400)
RBC: 5.28 MIL/uL (ref 4.22–5.81)
RDW: 15 % (ref 11.5–15.5)
WBC: 10.5 K/uL (ref 4.0–10.5)
nRBC: 0 % (ref 0.0–0.2)

## 2024-03-02 LAB — BRAIN NATRIURETIC PEPTIDE: B Natriuretic Peptide: 970.9 pg/mL — ABNORMAL HIGH (ref 0.0–100.0)

## 2024-03-02 MED ORDER — ALBUTEROL SULFATE (2.5 MG/3ML) 0.083% IN NEBU
5.0000 mg | INHALATION_SOLUTION | Freq: Once | RESPIRATORY_TRACT | Status: AC
  Administered 2024-03-02: 5 mg via RESPIRATORY_TRACT
  Filled 2024-03-02: qty 6

## 2024-03-02 MED ORDER — ONDANSETRON HCL 4 MG/2ML IJ SOLN: 4.0000 mg | Freq: Four times a day (QID) | INTRAMUSCULAR | Status: DC | PRN

## 2024-03-02 MED ORDER — SODIUM CHLORIDE 0.9 % IV SOLN: 250.0000 mL | INTRAVENOUS | Status: AC | PRN

## 2024-03-02 MED ORDER — FUROSEMIDE 10 MG/ML IJ SOLN
40.0000 mg | Freq: Once | INTRAMUSCULAR | Status: AC
  Administered 2024-03-02: 40 mg via INTRAVENOUS
  Filled 2024-03-02: qty 4

## 2024-03-02 MED ORDER — ENOXAPARIN SODIUM 40 MG/0.4ML IJ SOSY
40.0000 mg | PREFILLED_SYRINGE | INTRAMUSCULAR | Status: DC
  Administered 2024-03-04 – 2024-03-06 (×3): 40 mg via SUBCUTANEOUS
  Filled 2024-03-02 (×4): qty 0.4

## 2024-03-02 MED ORDER — ALBUTEROL SULFATE (2.5 MG/3ML) 0.083% IN NEBU
3.0000 mL | INHALATION_SOLUTION | RESPIRATORY_TRACT | Status: DC | PRN
  Administered 2024-03-04 (×2): 3 mL via RESPIRATORY_TRACT
  Filled 2024-03-02 (×2): qty 3

## 2024-03-02 MED ORDER — SODIUM CHLORIDE 0.9% FLUSH
3.0000 mL | Freq: Two times a day (BID) | INTRAVENOUS | Status: DC
  Administered 2024-03-03 – 2024-03-06 (×6): 3 mL via INTRAVENOUS

## 2024-03-02 MED ORDER — IOHEXOL 350 MG/ML SOLN
75.0000 mL | Freq: Once | INTRAVENOUS | Status: AC | PRN
  Administered 2024-03-02: 75 mL via INTRAVENOUS

## 2024-03-02 MED ORDER — IPRATROPIUM BROMIDE 0.02 % IN SOLN
0.5000 mg | Freq: Once | RESPIRATORY_TRACT | Status: AC
  Administered 2024-03-02: 0.5 mg via RESPIRATORY_TRACT
  Filled 2024-03-02: qty 2.5

## 2024-03-02 MED ORDER — ACETAMINOPHEN 650 MG RE SUPP: 650.0000 mg | Freq: Four times a day (QID) | RECTAL | Status: DC | PRN

## 2024-03-02 MED ORDER — SODIUM CHLORIDE 0.9% FLUSH: 3.0000 mL | INTRAVENOUS | Status: DC | PRN

## 2024-03-02 MED ORDER — FLUTICASONE PROPIONATE 50 MCG/ACT NA SUSP
2.0000 | Freq: Every day | NASAL | Status: DC
  Administered 2024-03-04 – 2024-03-06 (×3): 2 via NASAL
  Filled 2024-03-02: qty 16

## 2024-03-02 MED ORDER — ACETAMINOPHEN 325 MG PO TABS
650.0000 mg | ORAL_TABLET | Freq: Four times a day (QID) | ORAL | Status: DC | PRN
  Administered 2024-03-03: 650 mg via ORAL
  Filled 2024-03-02: qty 2

## 2024-03-02 MED ORDER — METHYLPREDNISOLONE SODIUM SUCC 125 MG IJ SOLR
125.0000 mg | Freq: Once | INTRAMUSCULAR | Status: AC
  Administered 2024-03-02: 125 mg via INTRAVENOUS
  Filled 2024-03-02: qty 2

## 2024-03-02 MED ORDER — ONDANSETRON HCL 4 MG PO TABS: 4.0000 mg | ORAL_TABLET | Freq: Four times a day (QID) | ORAL | Status: DC | PRN

## 2024-03-02 MED ORDER — SENNOSIDES-DOCUSATE SODIUM 8.6-50 MG PO TABS: 1.0000 | ORAL_TABLET | Freq: Every evening | ORAL | Status: DC | PRN

## 2024-03-02 MED ORDER — NICOTINE 14 MG/24HR TD PT24
14.0000 mg | MEDICATED_PATCH | Freq: Every day | TRANSDERMAL | Status: DC
  Administered 2024-03-02 – 2024-03-06 (×5): 14 mg via TRANSDERMAL
  Filled 2024-03-02 (×5): qty 1

## 2024-03-02 MED ORDER — PREDNISONE 20 MG PO TABS: 40.0000 mg | ORAL_TABLET | Freq: Every day | ORAL | Status: DC

## 2024-03-02 MED ORDER — FUROSEMIDE 10 MG/ML IJ SOLN: 40.0000 mg | Freq: Every day | INTRAMUSCULAR | Status: DC

## 2024-03-02 MED ORDER — METHYLPREDNISOLONE SODIUM SUCC 40 MG IJ SOLR
40.0000 mg | Freq: Two times a day (BID) | INTRAMUSCULAR | Status: AC
  Administered 2024-03-02 – 2024-03-03 (×2): 40 mg via INTRAVENOUS
  Filled 2024-03-02 (×2): qty 1

## 2024-03-02 NOTE — ED Triage Notes (Signed)
 Pt c.o sob and swelling in his feet up to his abd since yesterday. Coarse crackles noted in triage. Pt 93% on room air in triage. Denies hx of CHF

## 2024-03-02 NOTE — ED Provider Notes (Signed)
 James Wright Provider Note   CSN: [id number] Arrival date & time: 03/02/24  1655     Patient presents with: Shortness of Breath   James Wright is a 58 y.o. male history of COPD, here presenting with shortness of breath.  Patient has been having shortness of breath for about a week or so.  Patient went to urgent care and had a chest x-ray that showed possible pulmonary edema.  Patient was thought to have early heart failure versus COPD exacerbation and was given a course of steroids and several days of Lasix .  He states that he is cough has not gotten better.  He states that he has worsening shortness of breath.  He also has some orthopnea and leg swelling as well.  Patient never had an echo and never was diagnosed with heart problems including heart failure or CAD.    Shortness of Breath      Prior to Admission medications  Medication Sig Start Date End Date Taking? Authorizing Provider  albuterol  (VENTOLIN  HFA) 108 (90 Base) MCG/ACT inhaler Inhale 2 puffs into the lungs every 4 (four) hours as needed for wheezing or shortness of breath. 02/21/24   Banister, Pamela K, MD  Ascorbic Acid (VITAMIN C PO) Take by mouth.    [provider]  fluticasone  (FLONASE ) 50 MCG/ACT nasal spray Place 2 sprays into both nostrils daily. Patient not taking: Reported on 01/10/2024 12/24/22   Paseda, Folashade R, FNP  furosemide  (LASIX ) 20 MG tablet Take 1 tablet (20 mg total) by mouth daily for 3 days. 02/21/24 02/24/24  Vonna Sharlet POUR, MD  potassium chloride  (KLOR-CON  M) 10 MEQ tablet Take 2 tablets (20 mEq total) by mouth daily for 3 days. 02/21/24 02/24/24  Vonna Sharlet POUR, MD  terbinafine  (LAMISIL ) 250 MG tablet Take 1 tablet (250 mg total) by mouth daily. Patient not taking: Reported on 01/10/2024 12/24/22   Paseda, Folashade R, FNP    Allergies: Patient has no known allergies.    Review of Systems  Respiratory:  Positive for shortness of  breath.   All other systems reviewed and are negative.   Updated Vital Signs BP 104/83   Pulse (!) 114   Temp 97.7 F (36.5 C) (Oral)   Resp (!) 23   Ht 6' 2 (1.88 m)   Wt 113.4 kg   SpO2 93%   BMI 32.10 kg/m   Physical Exam Vitals and nursing note reviewed.  Constitutional:      Comments: Tachypneic  HENT:     Head: Normocephalic.  Eyes:     Extraocular Movements: Extraocular movements intact.     Pupils: Pupils are equal, round, and reactive to light.  Cardiovascular:     Rate and Rhythm: Tachycardia present.  Pulmonary:     Comments: Tachypneic and mild diffuse wheezing.  Diminished bilateral bases. Musculoskeletal:        General: Normal range of motion.     Cervical back: Normal range of motion and neck supple.     Comments: 1+ edema bilaterally  Skin:    General: Skin is warm.     Capillary Refill: Capillary refill takes less than 2 seconds.  Neurological:     General: No focal deficit present.     Mental Status: He is oriented to person, place, and time.  Psychiatric:        Mood and Affect: Mood normal.        Behavior: Behavior normal.     (  all labs ordered are listed, but only abnormal results are displayed) Labs Reviewed  BASIC METABOLIC PANEL WITH GFR - Abnormal; Notable for the following components:      Result Value   Glucose, Bld 148 (*)    Calcium 8.7 (*)    All other components within normal limits  BRAIN NATRIURETIC PEPTIDE - Abnormal; Notable for the following components:   B Natriuretic Peptide 970.9 (*)    All other components within normal limits  TROPONIN I (HIGH SENSITIVITY) - Abnormal; Notable for the following components:   Troponin I (High Sensitivity) 60 (*)    All other components within normal limits  RESP PANEL BY RT-PCR (RSV, FLU A&B, COVID)  RVPGX2  CBC  TROPONIN I (HIGH SENSITIVITY)    EKG: EKG Interpretation Date/Time:  Friday March 02 2024 17:09:42 EST Ventricular Rate:  118 PR Interval:  152 QRS  Duration:  92 QT Interval:  322 QTC Calculation: 451 R Axis:   108  Text Interpretation: Sinus tachycardia Rightward axis Possible Anterior infarct , age undetermined Abnormal ECG No previous ECGs available Confirmed by Patt Alm DEL ([id number]) on 03/02/2024 5:56:35 PM  Radiology: CT Angio Chest PE W and/or Wo Contrast Result Date: 03/02/2024 CLINICAL DATA:  Shortness of breath EXAM: CT ANGIOGRAPHY CHEST WITH CONTRAST TECHNIQUE: Multidetector CT imaging of the chest was performed using the standard protocol during bolus administration of intravenous contrast. Multiplanar CT image reconstructions and MIPs were obtained to evaluate the vascular anatomy. RADIATION DOSE REDUCTION: This exam was performed according to the departmental dose-optimization program which includes automated exposure control, adjustment of the mA and/or kV according to patient size and/or use of iterative reconstruction technique. CONTRAST:  75mL OMNIPAQUE  IOHEXOL  350 MG/ML SOLN COMPARISON:  Chest x-ray 03/02/2024 FINDINGS: Cardiovascular: Satisfactory opacification of the pulmonary arteries to the segmental level. No evidence of pulmonary embolism. Nonaneurysmal aorta. Mild atherosclerosis. Cardiomegaly. No significant pericardial effusion Mediastinum/Nodes: Mild bowing of the posterior trachea and slight narrowed appearance of the main bronchi. Mild bilateral bronchial wall thickening. No thyroid mass. Borderline AP window lymph nodes measuring up to 9 mm. Right low paratracheal nodes measuring up to 14 mm. Left precarinal node measuring 13 mm. Small hilar nodes, on the right measuring up to 12 mm and on the left measuring up to 11 mm. Small hiatal hernia and mild circumferential distal esophageal thickening. Lungs/Pleura: No acute focal airspace disease. No pneumothorax. Mild subpleural reticulation along the anterior upper lobes. Trace right-sided pleural effusion. Upper Abdomen: No definite acute finding, respiratory motion  degradation. Indistinct appearing gallbladder. Musculoskeletal: No acute osseous abnormality. Mild generalized subcutaneous edema. Small amount of fluid and edema superficial to the sternum Review of the MIP images confirms the above findings. IMPRESSION: 1. Negative for acute pulmonary embolus. 2. Cardiomegaly. Trace right-sided pleural effusion. 3. Mild mediastinal and hilar adenopathy, nonspecific in appearance. Consider short-term CT follow-up to assess for interval change. 4. Slight bowing of the posterior trachea and slightly narrowed appearance of the bilateral bronchi, correlate for tracheal malacia/tracheal bronchomalacia. Mild bilateral bronchial wall thickening may be seen with airways inflammatory process 5. Small hiatal hernia and mild circumferential distal esophageal thickening, question reflux or esophagitis. 6. Indistinct appearing gallbladder, appearance suspected to be related to motion artifact, if there are symptoms referable to the right upper quadrant, ultrasound could be performed. 7. Aortic atherosclerosis. Aortic Atherosclerosis (ICD10-I70.0). Electronically Signed   By: Luke Bun M.D.   On: 03/02/2024 20:29   DG Chest Portable 1 View Result Date: 03/02/2024 EXAM: 1 VIEW(S)  XRAY OF THE CHEST 03/02/2024 06:04:00 PM COMPARISON: 02/21/2024 CLINICAL HISTORY: SOB FINDINGS: LUNGS AND PLEURA: Stable interstitial prominence. No pleural effusion. No pneumothorax. HEART AND MEDIASTINUM: Cardiomegaly, unchanged. BONES AND SOFT TISSUES: No acute osseous abnormality. IMPRESSION: 1. Cardiomegaly, unchanged. 2. Stable interstitial prominence. Electronically signed by: Greig Pique MD 03/02/2024 06:59 PM EST RP Workstation: [id number]     Procedures   CRITICAL CARE Performed by: Alm VEAR Cave   Total critical care time: 37 minutes  Critical care time was exclusive of separately billable procedures and treating other patients.  Critical care was necessary to treat or prevent imminent or  life-threatening deterioration.  Critical care was time spent personally by me on the following activities: development of treatment plan with patient and/or surrogate as well as nursing, discussions with consultants, evaluation of patient's response to treatment, examination of patient, obtaining history from patient or surrogate, ordering and performing treatments and interventions, ordering and review of laboratory studies, ordering and review of radiographic studies, pulse oximetry and re-evaluation of patient's condition.   Medications Ordered in the ED  albuterol  (PROVENTIL ) (2.5 MG/3ML) 0.083% nebulizer solution 5 mg (has no administration in time range)  methylPREDNISolone  sodium succinate (SOLU-MEDROL ) 125 mg/2 mL injection 125 mg (125 mg Intravenous Given 03/02/24 1923)  albuterol  (PROVENTIL ) (2.5 MG/3ML) 0.083% nebulizer solution 5 mg (5 mg Nebulization Given 03/02/24 1847)  ipratropium (ATROVENT) nebulizer solution 0.5 mg (0.5 mg Nebulization Given 03/02/24 1847)  furosemide  (LASIX ) injection 40 mg (40 mg Intravenous Given 03/02/24 1936)  iohexol  (OMNIPAQUE ) 350 MG/ML injection 75 mL (75 mLs Intravenous Contrast Given 03/02/24 1955)                                    Medical Decision Making James Wright is a 58 y.o. male here presenting with shortness of breath.  Patient was treated for COPD and possible CHF about a week ago at urgent care.  Patient appears overly overloaded.  Patient is also tachycardic and hypoxic.  Concern for combination of COPD versus CHF versus pneumonia.  Plan to get CBC and CMP and BNP and chest x-ray.  If chest x-ray did not show any obvious volume overload may need CTA chest to rule out PE  8:37 PM Patient BNP is elevated at 1000.  Troponin is elevated at 60.  Chest x-ray is clear and CT showed cardiomegaly with trace pleural effusion.  Patient is still wheezing and tachycardic.  Patient was also hypoxic on arrival.  Patient will be admitted for COPD  exacerbation and CHF with hypoxia.   Problems Addressed: Acute on chronic congestive heart failure, unspecified heart failure type [facility]): acute illness or injury Chronic obstructive pulmonary disease with acute exacerbation (HCC): acute illness or injury Hypoxia: acute illness or injury  Amount and/or Complexity of Data Reviewed Labs: ordered. Decision-making details documented in ED Course. Radiology: ordered and independent interpretation performed. Decision-making details documented in ED Course.  Risk Prescription drug management. Decision regarding hospitalization.     Final diagnoses:  None    ED Discharge Orders     None          Cave Alm Macho, MD 03/02/24 2038

## 2024-03-02 NOTE — ED Triage Notes (Signed)
 Pt c/o SHOB that started last night as well as chest pain and dry cough.

## 2024-03-02 NOTE — H&P (Signed)
 History and Physical    Patient: James Wright FMW:[id number] DOB: 09/09/1965 DOA: 03/02/2024 DOS: the patient was seen and examined on 03/02/2024 PCP: Paseda, Folashade R, FNP  Patient coming from: Home  Chief Complaint:  Chief Complaint  Patient presents with   Shortness of Breath   HPI: DOW BLAHNIK is a 58 y.o. male with medical history significant of chronic tobacco smoking, THC use, hidradenitis.  He presents to the emergency room with complaints of progressive worsening shortness of breath over the past couple of weeks.  He had previously been seen in the emergency room and was initiated on treatment for suspected acute bronchitis.  Patient was initiated on treatment with steroids and azithromycin .  Despite this treatment, patient reports no improvement.  He denies any fever, chills but admits to worsening lower extremity edema.  He also reported orthopnea and paroxysmal nocturnal dyspnea. He admitted to worsening lower extremity edema and weight gain of about 30 pounds over the past several months.  No prior diagnosis of CHF.  Denies any sick contacts.  In the ED, patient was given round of IV Lasix .  He was also found to be slightly hypoxic hence oxygen supplementation with 2 L was offered.  CT angiogram done in the ED ruled out pulm emboli.  Cardiomegaly with trace right-sided pleural effusion noted.  Bronchial narrowing with suspected tracheal broncomalacia noted.  Review of Systems: As mentioned in the history of present illness. All other systems reviewed and are negative. Past Medical History:  Diagnosis Date   Blindness of left eye    Hidradenitis    Marijuana smoker 12/24/2022   Tobacco use disorder, moderate, dependence 02/16/2017   Past Surgical History:  Procedure Laterality Date   BACK SURGERY     Social History:  reports that he has been smoking cigarettes. He has never used smokeless tobacco. He reports current alcohol use. He reports current drug use.  Drug: Marijuana.  Allergies[1]  Family History  Problem Relation Age of Onset   Colon cancer Neg Hx    Skin cancer Neg Hx     Prior to Admission medications  Medication Sig Start Date End Date Taking? Authorizing Provider  albuterol  (VENTOLIN  HFA) 108 (90 Base) MCG/ACT inhaler Inhale 2 puffs into the lungs every 4 (four) hours as needed for wheezing or shortness of breath. 02/21/24   Banister, Pamela K, MD  Ascorbic Acid (VITAMIN C PO) Take by mouth.    [provider]  fluticasone  (FLONASE ) 50 MCG/ACT nasal spray Place 2 sprays into both nostrils daily. Patient not taking: Reported on 01/10/2024 12/24/22   Paseda, Folashade R, FNP  furosemide  (LASIX ) 20 MG tablet Take 1 tablet (20 mg total) by mouth daily for 3 days. 02/21/24 02/24/24  Vonna Sharlet POUR, MD  potassium chloride  (KLOR-CON  M) 10 MEQ tablet Take 2 tablets (20 mEq total) by mouth daily for 3 days. 02/21/24 02/24/24  Vonna Sharlet POUR, MD  terbinafine  (LAMISIL ) 250 MG tablet Take 1 tablet (250 mg total) by mouth daily. Patient not taking: Reported on 01/10/2024 12/24/22   Paseda, Folashade R, FNP    Physical Exam: Vitals:   03/02/24 1719 03/02/24 1720 03/02/24 1830 03/02/24 1844  BP:   104/83   Pulse:  (!) 115 (!) 114   Resp:   (!) 23   Temp:    97.7 F (36.5 C)  TempSrc:    Oral  SpO2:  98% 93%   Weight: 113.4 kg     Height: 6' 2 (1.88 m)  General: Patient is alert and oriented.  Family at bedside. HEENT: Neck: Supple with slight JVD Chest: Diminished breath sounds bilaterally.  No appreciable Rales or rhonchi appreciated. Abdomen: Rotund slightly distended.  No organomegaly palpable. Extremities with profound edema up to the knee. CNS shows no focal deficits.  Cranial nerves grossly intact. Skin: Significant for skin infections.  Data Reviewed: Sodium is 140, potassium 4.0, chloride is 104, bicarb 26, glucose 148, BUN 17, creatinine is 1.0, calcium 8.7, BNP 970 troponin is 60 and 60 respectively.   A1c 6.3  WBCs 10.5, hemoglobin is 15.4, hematocrit is 48, platelet count 298  CTA IMPRESSION: 1. Negative for acute pulmonary embolus. 2. Cardiomegaly. Trace right-sided pleural effusion. 3. Mild mediastinal and hilar adenopathy, nonspecific in appearance. Consider short-term CT follow-up to assess for interval change. 4. Slight bowing of the posterior trachea and slightly narrowed appearance of the bilateral bronchi, correlate for tracheal malacia/tracheal bronchomalacia. Mild bilateral bronchial wall thickening may be seen with airways inflammatory process 5. Small hiatal hernia and mild circumferential distal esophageal thickening, question reflux or esophagitis. 6. Indistinct appearing gallbladder, appearance suspected to be related to motion artifact, if there are symptoms referable to the right upper quadrant, ultrasound could be performed. 7. Aortic atherosclerosis  Assessment and Plan:  58 year old gentleman presented with acute shortness of breath.  Recently treated for acute bronchitis with minimal improvement.  Symptoms most likely consistent with CHF.  Patient also has some evidence of tracheal broncomalacia with inflammatory changes.  #1.  Acute shortness of breath: Likely multifactorial etiology secondary to congestive heart failure and underlying COPD/bronchitis.  #2.  Acute CHF: Unknown left ventricular ejection fraction.Will obtain echocardiogram on this admission to eval for LVEF and RVSP.  Optimize with IV Lasix  40 mg daily.  Daily weight monitoring.  Patient denies any alcohol use.Monitor renal function carefully on diuretics.  #3.  Acute on chronic bronchitis: Findings of bronchial narrowing with findings suggesting possible tracheobronchomalacia.  Patient may be optimized with low-dose steroids.  As needed albuterol .  No indication for antibiotics at this time.  #4.  Tobacco and THC dependent: Cessation counseling was offered.  Nicotine patch will be given.  #5.   History of hydradenitis suppurativa   Advance Care Planning:   Code Status: Full Code   Consults: None for now, Cardiology consult pending ECHO  Family Communication:   Severity of Illness: The appropriate patient status for this patient is INPATIENT. Inpatient status is judged to be reasonable and necessary in order to provide the required intensity of service to ensure the patient's safety. The patient's presenting symptoms, physical exam findings, and initial radiographic and laboratory data in the context of their chronic comorbidities is felt to place them at high risk for further clinical deterioration. Furthermore, it is not anticipated that the patient will be medically stable for discharge from the hospital within 2 midnights of admission.   * I certify that at the point of admission it is my clinical judgment that the patient will require inpatient hospital care spanning beyond 2 midnights from the point of admission due to high intensity of service, high risk for further deterioration and high frequency of surveillance required.*  Author: Maude MARLA Dart, MD 03/02/2024 9:09 PM  For on call review [email or web address].      [1] No Known Allergies

## 2024-03-03 ENCOUNTER — Inpatient Hospital Stay (HOSPITAL_COMMUNITY)

## 2024-03-03 DIAGNOSIS — N179 Acute kidney failure, unspecified: Secondary | ICD-10-CM

## 2024-03-03 DIAGNOSIS — Z720 Tobacco use: Secondary | ICD-10-CM

## 2024-03-03 DIAGNOSIS — F1490 Cocaine use, unspecified, uncomplicated: Secondary | ICD-10-CM

## 2024-03-03 DIAGNOSIS — E8770 Fluid overload, unspecified: Secondary | ICD-10-CM

## 2024-03-03 LAB — RESPIRATORY PANEL BY PCR

## 2024-03-03 LAB — URINALYSIS, ROUTINE W REFLEX MICROSCOPIC
Bacteria, UA: NONE SEEN
Bilirubin Urine: NEGATIVE
Glucose, UA: >=500 mg/dL — AB
Hgb urine dipstick: NEGATIVE
Ketones, ur: NEGATIVE mg/dL
Leukocytes,Ua: NEGATIVE
Nitrite: NEGATIVE
Protein, ur: NEGATIVE mg/dL
Specific Gravity, Urine: 1.024 (ref 1.005–1.030)
pH: 7 (ref 5.0–8.0)

## 2024-03-03 LAB — CBC
HCT: 49.7 % (ref 39.0–52.0)
Hemoglobin: 15.8 g/dL (ref 13.0–17.0)
MCH: 29.9 pg (ref 26.0–34.0)
MCHC: 31.8 g/dL (ref 30.0–36.0)
MCV: 94.1 fL (ref 80.0–100.0)
Platelets: 273 K/uL (ref 150–400)
RBC: 5.28 MIL/uL (ref 4.22–5.81)
RDW: 15.2 % (ref 11.5–15.5)
WBC: 11.7 K/uL — ABNORMAL HIGH (ref 4.0–10.5)
nRBC: 0 % (ref 0.0–0.2)

## 2024-03-03 LAB — NA AND K (SODIUM & POTASSIUM), RAND UR
Potassium Urine: 53 mmol/L
Sodium, Ur: 99 mmol/L

## 2024-03-03 LAB — BASIC METABOLIC PANEL WITH GFR
Anion gap: 9 (ref 5–15)
BUN: 15 mg/dL (ref 6–20)
CO2: 30 mmol/L (ref 22–32)
Calcium: 8.4 mg/dL — ABNORMAL LOW (ref 8.9–10.3)
Chloride: 101 mmol/L (ref 98–111)
Creatinine, Ser: 1.31 mg/dL — ABNORMAL HIGH (ref 0.61–1.24)
GFR, Estimated: >60 mL/min (ref >60)
Glucose, Bld: 281 mg/dL — ABNORMAL HIGH (ref 70–99)
Potassium: 4.6 mmol/L (ref 3.5–5.1)
Sodium: 140 mmol/L (ref 135–145)

## 2024-03-03 LAB — CREATININE, URINE, RANDOM: Creatinine, Urine: 78 mg/dL

## 2024-03-03 MED ORDER — FUROSEMIDE 10 MG/ML IJ SOLN
40.0000 mg | Freq: Every day | INTRAMUSCULAR | Status: DC
  Administered 2024-03-03 – 2024-03-05 (×3): 40 mg via INTRAVENOUS
  Filled 2024-03-03 (×3): qty 4

## 2024-03-03 NOTE — Plan of Care (Signed)
°  Problem: Education: Goal: Knowledge of the prescribed therapeutic regimen will improve Outcome: Progressing   Problem: Activity: Goal: Will verbalize the importance of balancing activity with adequate rest periods Outcome: Progressing   Problem: Respiratory: Goal: Ability to maintain a clear airway will improve Outcome: Progressing

## 2024-03-03 NOTE — Progress Notes (Signed)
 Attempted echo twice today.  At first patient was getting an ultrasound and when I came back again, he asked that I come back tomorrow since he was eating.

## 2024-03-03 NOTE — Progress Notes (Signed)
 PROGRESS NOTE    SCHNEIDER WARCHOL  FMW:[id number] DOB: 02-20-1966 DOA: 03/02/2024 PCP: Paseda, Folashade R, FNP  Subjective: No acute events overnight. Seen and examined at bedside. Reports feeling better with improvement in his breathing. Reports slight improvement in bilateral leg edema.  Tolerating oral intake without n/v. Denies constipation  Hospital Course:  58 y.o. male with medical history significant of chronic tobacco smoking, THC use, hidradenitis.  He presents to the emergency room with complaints of progressive worsening shortness of breath over the past couple of weeks.  He had previously been seen in the emergency room and was initiated on treatment for suspected acute bronchitis.  Patient was initiated on treatment with steroids and azithromycin .  Despite this treatment, patient reports no improvement.  He denies any fever, chills but admits to worsening lower extremity edema.  He also reported orthopnea and paroxysmal nocturnal dyspnea. He admitted to worsening lower extremity edema and weight gain of about 30 pounds over the past several months.  No prior diagnosis of CHF.  Denies any sick contacts.   In the ED, patient was given round of IV Lasix .  He was also found to be slightly hypoxic hence oxygen supplementation with 2 L was offered.  CT angiogram done in the ED ruled out pulm emboli.  Cardiomegaly with trace right-sided pleural effusion noted.  Bronchial narrowing with suspected tracheal broncomalacia noted.   Assessment and Plan:  58 year old gentleman presented with acute shortness of breath.  Recently treated for acute bronchitis with minimal improvement.  Symptoms most likely consistent with CHF.  Patient also has some evidence of tracheal broncomalacia with inflammatory changes.   Fluid overload state Etiology unclear, concern for new onset acute heart failure Clinically fluid overloaded Patient says he was given diuretics once a couple of weeks ago for leg edema  but it doesn't seem it he take it regularly Decrease IV lasix  to 40mg  daily  TTE pending Will get venous US  bilateral legs to rule out acute DVT Monitor I/Os, daily weights Monitor on tele Monitor electrolytes and renal function Will get cardiology consult after TTE results   Acute kidney injury Cr 1.31 < 1.02, baseline 0.6-0.8 although last lab work is very old Etiology unclear Clinically fluid overloaded Diuresis as elsewhere Bladder scan 0  Will check UA, Ulytes, renal US  Monitor I/Os, weights Monitor BMP   Presumed COPD Tracheobronchomalacia As per patient, no prior PFTs or diagnosis of COPD/emphysema/chronic bronchitis Does admit to tobacco use Stop steroids as this may worsen fluid retention No indication for antibiotics at this time. Cont albuterol  PRN  Polysubstance use disorder Patient admits to tobacco, THC and cocaine use  Counseled on cessation  Continue Nicotine  patch  Will check Utox Will get TOC consult   History of hydradenitis suppurativa Monitor clinically  DVT prophylaxis: enoxaparin  (LOVENOX ) injection 40 mg Start: 03/03/24 1000 SCDs Start: 03/02/24 2106  Lovenox    Code Status: Full Code  Disposition Plan: TBD pending clinical course Reason for continuing need for hospitalization: IV diuretics, venous US , renal US  and TTE pending, TOC consult pending, may need cardio consult  Objective: Vitals:   03/03/24 0119 03/03/24 0547 03/03/24 0800 03/03/24 0929  BP:  109/72 115/71   Pulse:  (!) 110 (!) 108   Resp:  17 17   Temp: 97.9 F (36.6 C) 98.3 F (36.8 C)  98 F (36.7 C)  TempSrc: Oral Oral  Oral  SpO2:  98% 93%   Weight:      Height:  Intake/Output Summary (Last 24 hours) at 03/03/2024 1109 Last data filed at 03/03/2024 0703 Gross per 24 hour  Intake --  Output 3500 ml  Net -3500 ml   Filed Weights   03/02/24 1719  Weight: 113.4 kg    Examination:  Physical Exam Vitals and nursing note reviewed.  Constitutional:       General: He is not in acute distress.    Appearance: He is obese. He is ill-appearing.     Comments: disheveled  HENT:     Head: Normocephalic and atraumatic.  Cardiovascular:     Rate and Rhythm: Normal rate and regular rhythm.     Pulses: Normal pulses.     Heart sounds: Normal heart sounds.  Pulmonary:     Effort: Pulmonary effort is normal. No respiratory distress.     Breath sounds: Normal breath sounds. No wheezing.  Abdominal:     General: Bowel sounds are normal. There is no distension.     Palpations: Abdomen is soft.     Tenderness: There is no abdominal tenderness. There is no guarding or rebound.  Musculoskeletal:     Right lower leg: Edema present.     Left lower leg: Edema present.  Neurological:     Mental Status: He is alert. Mental status is at baseline.     Data Reviewed: I have personally reviewed following labs and imaging studies  CBC: Recent Labs  Lab 03/02/24 1805 03/03/24 0455  WBC 10.5 11.7*  HGB 15.4 15.8  HCT 48.1 49.7  MCV 91.1 94.1  PLT 298 273   Basic Metabolic Panel: Recent Labs  Lab 03/02/24 1805 03/03/24 0455  NA 140 140  K 4.0 4.6  CL 104 101  CO2 26 30  GLUCOSE 148* 281*  BUN 17 15  CREATININE 1.02 1.31*  CALCIUM 8.7* 8.4*   GFR: Estimated Creatinine Clearance: 84.3 mL/min (A) (by C-G formula based on SCr of 1.31 mg/dL (H)). Liver Function Tests: No results for input(s): AST, ALT, ALKPHOS, BILITOT, PROT, ALBUMIN in the last 168 hours. No results for input(s): LIPASE, AMYLASE in the last 168 hours. No results for input(s): AMMONIA in the last 168 hours. Coagulation Profile: No results for input(s): INR, PROTIME in the last 168 hours. Cardiac Enzymes: No results for input(s): CKTOTAL, CKMB, CKMBINDEX, TROPONINI in the last 168 hours. ProBNP, BNP (last 5 results) Recent Labs    03/02/24 1805  BNP 970.9*   HbA1C: No results for input(s): HGBA1C in the last 72 hours. CBG: No results  for input(s): GLUCAP in the last 168 hours. Lipid Profile: No results for input(s): CHOL, HDL, LDLCALC, TRIG, CHOLHDL, LDLDIRECT in the last 72 hours. Thyroid Function Tests: No results for input(s): TSH, T4TOTAL, FREET4, T3FREE, THYROIDAB in the last 72 hours. Anemia Panel: No results for input(s): VITAMINB12, FOLATE, FERRITIN, TIBC, IRON, RETICCTPCT in the last 72 hours. Sepsis Labs: No results for input(s): PROCALCITON, LATICACIDVEN in the last 168 hours.  Recent Results (from the past 240 hours)  Resp panel by RT-PCR (RSV, Flu A&B, Covid) Anterior Nasal Swab     Status: None   Collection Time: 03/02/24  5:56 PM   Specimen: Anterior Nasal Swab  Result Value Ref Range Status   SARS Coronavirus 2 by RT PCR NEGATIVE NEGATIVE Final   Influenza A by PCR NEGATIVE NEGATIVE Final   Influenza B by PCR NEGATIVE NEGATIVE Final    Comment: (NOTE) The Xpert Xpress SARS-CoV-2/FLU/RSV plus assay is intended as an aid in the diagnosis of influenza  from Nasopharyngeal swab specimens and should not be used as a sole basis for treatment. Nasal washings and aspirates are unacceptable for Xpert Xpress SARS-CoV-2/FLU/RSV testing.  Fact Sheet for Patients: bloggercourse.com  Fact Sheet for Healthcare Providers: seriousbroker.it  This test is not yet approved or cleared by the [place]  FDA and has been authorized for detection and/or diagnosis of SARS-CoV-2 by FDA under an Emergency Use Authorization (EUA). This EUA will remain in effect (meaning this test can be used) for the duration of the COVID-19 declaration under Section 564(b)(1) of the Act, 21 U.S.C. section 360bbb-3(b)(1), unless the authorization is terminated or revoked.     Resp Syncytial Virus by PCR NEGATIVE NEGATIVE Final    Comment: (NOTE) Fact Sheet for Patients: bloggercourse.com  Fact Sheet for Healthcare  Providers: seriousbroker.it  This test is not yet approved or cleared by the [place]  FDA and has been authorized for detection and/or diagnosis of SARS-CoV-2 by FDA under an Emergency Use Authorization (EUA). This EUA will remain in effect (meaning this test can be used) for the duration of the COVID-19 declaration under Section 564(b)(1) of the Act, 21 U.S.C. section 360bbb-3(b)(1), unless the authorization is terminated or revoked.  Performed at [facility], [address] [place], [place] [address]      Radiology Studies: CT Angio Chest PE W and/or Wo Contrast Result Date: 03/02/2024 CLINICAL DATA:  Shortness of breath EXAM: CT ANGIOGRAPHY CHEST WITH CONTRAST TECHNIQUE: Multidetector CT imaging of the chest was performed using the standard protocol during bolus administration of intravenous contrast. Multiplanar CT image reconstructions and MIPs were obtained to evaluate the vascular anatomy. RADIATION DOSE REDUCTION: This exam was performed according to the departmental dose-optimization program which includes automated exposure control, adjustment of the mA and/or kV according to patient size and/or use of iterative reconstruction technique. CONTRAST:  75mL OMNIPAQUE  IOHEXOL  350 MG/ML SOLN COMPARISON:  Chest x-ray 03/02/2024 FINDINGS: Cardiovascular: Satisfactory opacification of the pulmonary arteries to the segmental level. No evidence of pulmonary embolism. Nonaneurysmal aorta. Mild atherosclerosis. Cardiomegaly. No significant pericardial effusion Mediastinum/Nodes: Mild bowing of the posterior trachea and slight narrowed appearance of the main bronchi. Mild bilateral bronchial wall thickening. No thyroid mass. Borderline AP window lymph nodes measuring up to 9 mm. Right low paratracheal nodes measuring up to 14 mm. Left precarinal node measuring 13 mm. Small hilar nodes, on the right measuring up to 12 mm and on the left measuring up to 11 mm. Small  hiatal hernia and mild circumferential distal esophageal thickening. Lungs/Pleura: No acute focal airspace disease. No pneumothorax. Mild subpleural reticulation along the anterior upper lobes. Trace right-sided pleural effusion. Upper Abdomen: No definite acute finding, respiratory motion degradation. Indistinct appearing gallbladder. Musculoskeletal: No acute osseous abnormality. Mild generalized subcutaneous edema. Small amount of fluid and edema superficial to the sternum Review of the MIP images confirms the above findings. IMPRESSION: 1. Negative for acute pulmonary embolus. 2. Cardiomegaly. Trace right-sided pleural effusion. 3. Mild mediastinal and hilar adenopathy, nonspecific in appearance. Consider short-term CT follow-up to assess for interval change. 4. Slight bowing of the posterior trachea and slightly narrowed appearance of the bilateral bronchi, correlate for tracheal malacia/tracheal bronchomalacia. Mild bilateral bronchial wall thickening may be seen with airways inflammatory process 5. Small hiatal hernia and mild circumferential distal esophageal thickening, question reflux or esophagitis. 6. Indistinct appearing gallbladder, appearance suspected to be related to motion artifact, if there are symptoms referable to the right upper quadrant, ultrasound could be performed. 7. Aortic atherosclerosis. Aortic Atherosclerosis (  ICD10-I70.0). Electronically Signed   By: Luke Bun M.D.   On: 03/02/2024 20:29   DG Chest Portable 1 View Result Date: 03/02/2024 EXAM: 1 VIEW(S) XRAY OF THE CHEST 03/02/2024 06:04:00 PM COMPARISON: 02/21/2024 CLINICAL HISTORY: SOB FINDINGS: LUNGS AND PLEURA: Stable interstitial prominence. No pleural effusion. No pneumothorax. HEART AND MEDIASTINUM: Cardiomegaly, unchanged. BONES AND SOFT TISSUES: No acute osseous abnormality. IMPRESSION: 1. Cardiomegaly, unchanged. 2. Stable interstitial prominence. Electronically signed by: Greig Pique MD 03/02/2024 06:59 PM EST RP  Workstation: [id number]    Scheduled Meds:  enoxaparin  (LOVENOX ) injection  40 mg Subcutaneous Q24H   fluticasone   2 spray Each Nare Daily   nicotine   14 mg Transdermal Daily   [START ON 03/04/2024] predniSONE   40 mg Oral Q breakfast   sodium chloride  flush  3 mL Intravenous Q12H   Continuous Infusions:  sodium chloride        LOS: 1 day   Norval Bar, MD  [facility]  03/03/2024, 11:09 AM

## 2024-03-03 NOTE — Evaluation (Signed)
 Occupational Therapy Evaluation Patient Details Name: James Wright MRN: [id number] DOB: 1965/06/01 Today's Date: 03/03/2024   History of Present Illness   58 y/o M presenting to ED on 12/12 with SOB, admitted for COPD exacerbation. CTA negative for PE, cardiomegaly with trace R pleural effusion.    PMH includes COPD, Leye blindness, back   surgery     Clinical Impressions Pt reports ind at baseline with ADLs/functional mobility, lives with his girlfriend who can assist at d/c. Pt currently at baseline, performing ADLs without assist and mobilizing independently. SpO2 97% on RA with ambulation, educated on energy conservation strategies and activity pacing for home. Pt presenting with impairments listed below, however has no further acute OT needs at this time, will s/o. Please reconsult if there is a change in pt status.     If plan is discharge home, recommend the following:   Assistance with cooking/housework     Functional Status Assessment   Patient has had a recent decline in their functional status and demonstrates the ability to make significant improvements in function in a reasonable and predictable amount of time.     Equipment Recommendations   None recommended by OT     Recommendations for Other Services         Precautions/Restrictions   Precautions Precaution/Restrictions Comments: watch HR Restrictions Weight Bearing Restrictions Per Provider Order: No     Mobility Bed Mobility Overal bed mobility: Independent                  Transfers Overall transfer level: Independent                        Balance Overall balance assessment: No apparent balance deficits (not formally assessed)                                         ADL either performed or assessed with clinical judgement   ADL Overall ADL's : At baseline                                       General ADL Comments: pt simulated  LB ADL and able to ambulate household distance without AD     Vision   Additional Comments: 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     Perception Perception: Not tested       Praxis Praxis: Not tested       Pertinent Vitals/Pain Pain Assessment Pain Assessment: No/denies pain     Extremity/Trunk Assessment Upper Extremity Assessment Upper Extremity Assessment: Overall WFL for tasks assessed   Lower Extremity Assessment Lower Extremity Assessment: Overall WFL for tasks assessed   Cervical / Trunk Assessment Cervical / Trunk Assessment: Normal   Communication Communication Communication: No apparent difficulties   Cognition Arousal: Alert Behavior During Therapy: WFL for tasks assessed/performed Cognition: No apparent impairments                               Following commands: Intact       Cueing  General Comments   Cueing Techniques: Verbal cues  tachy 120-130bpm with ambulation   Exercises     Shoulder Instructions      Home Living Family/patient expects to be discharged to::  Private residence Living Arrangements: Spouse/significant other Available Help at Discharge: Available 24 hours/day;Friend(s) Type of Home: House Home Access: Stairs to enter Secretary/administrator of Steps: 2   Home Layout: One level     Bathroom Shower/Tub: Tub/shower unit         Home Equipment: None          Prior Functioning/Environment Prior Level of Function : Independent/Modified Independent;Driving                    OT Problem List: Decreased activity tolerance   OT Treatment/Interventions:        OT Goals(Current goals can be found in the care plan section)   Acute Rehab OT Goals Patient Stated Goal: none stated OT Goal Formulation: With patient Time For Goal Achievement: 03/17/24 Potential to Achieve Goals: Good   OT Frequency:       Co-evaluation              AM-PAC OT 6 Clicks Daily Activity     Outcome Measure Help  from another person eating meals?: None Help from another person taking care of personal grooming?: None Help from another person toileting, which includes using toliet, bedpan, or urinal?: None Help from another person bathing (including washing, rinsing, drying)?: A Little Help from another person to put on and taking off regular upper body clothing?: None Help from another person to put on and taking off regular lower body clothing?: None 6 Click Score: 23   End of Session Nurse Communication: Mobility status  Activity Tolerance: Patient tolerated treatment well Patient left: in bed;with call bell/phone within reach  OT Visit Diagnosis: Muscle weakness (generalized) (M62.81)                Time: 9177-9168 OT Time Calculation (min): 9 min Charges:  OT General Charges $OT Visit: 1 Visit OT Evaluation $OT Eval Low Complexity: 1 Low  Laneta POUR, OTD, OTR/L SecureChat Preferred Acute Rehab [phone]   Amaka Gluth K Koonce 03/03/2024, 8:38 AM

## 2024-03-03 NOTE — Progress Notes (Signed)
 Physical Therapy Note  Spoke with occupational therapy after their initial evaluation. OT reports patient is functioning at a high level of independence and no physical therapy is indicated at this time. Ambulatory in hallway on room air without issues. PT is signing-off. Please re-order if there is any significant change in status. Thank you for this referral.  Leontine Roads, PT, DPT [facility]  Rehabilitation Services Physical Therapist Office: [phone] Website: [email or web address]

## 2024-03-04 ENCOUNTER — Inpatient Hospital Stay (HOSPITAL_COMMUNITY)

## 2024-03-04 DIAGNOSIS — J441 Chronic obstructive pulmonary disease with (acute) exacerbation: Principal | ICD-10-CM | POA: Diagnosis present

## 2024-03-04 DIAGNOSIS — I5040 Unspecified combined systolic (congestive) and diastolic (congestive) heart failure: Secondary | ICD-10-CM

## 2024-03-04 DIAGNOSIS — I5021 Acute systolic (congestive) heart failure: Secondary | ICD-10-CM

## 2024-03-04 HISTORY — PX: TRANSTHORACIC ECHOCARDIOGRAM: SHX275

## 2024-03-04 HISTORY — DX: Unspecified combined systolic (congestive) and diastolic (congestive) heart failure: I50.40

## 2024-03-04 LAB — RAPID URINE DRUG SCREEN, HOSP PERFORMED
Amphetamines: NOT DETECTED
Barbiturates: NOT DETECTED
Benzodiazepines: NOT DETECTED
Cocaine: POSITIVE — AB
Opiates: NOT DETECTED
Tetrahydrocannabinol: POSITIVE — AB

## 2024-03-04 LAB — BASIC METABOLIC PANEL WITH GFR
Anion gap: 10 (ref 5–15)
BUN: 23 mg/dL — ABNORMAL HIGH (ref 6–20)
CO2: 23 mmol/L (ref 22–32)
Calcium: 8.7 mg/dL — ABNORMAL LOW (ref 8.9–10.3)
Chloride: 105 mmol/L (ref 98–111)
Creatinine, Ser: 1.13 mg/dL (ref 0.61–1.24)
GFR, Estimated: >60 mL/min (ref >60)
Glucose, Bld: 172 mg/dL — ABNORMAL HIGH (ref 70–99)
Potassium: 4.3 mmol/L (ref 3.5–5.1)
Sodium: 138 mmol/L (ref 135–145)

## 2024-03-04 LAB — ECHOCARDIOGRAM COMPLETE
Area-P 1/2: 4.49 cm2
Calc EF: 7.9 %
Height: 74 in
S' Lateral: 5.3 cm
Single Plane A2C EF: 14.6 %
Single Plane A4C EF: 5.6 %
Weight: 4000 [oz_av]

## 2024-03-04 LAB — GLUCOSE, CAPILLARY
Glucose-Capillary: 142 mg/dL — ABNORMAL HIGH (ref 70–99)
Glucose-Capillary: 173 mg/dL — ABNORMAL HIGH (ref 70–99)

## 2024-03-04 LAB — HEMOGLOBIN A1C
Hgb A1c MFr Bld: 6.5 % — ABNORMAL HIGH (ref 4.8–5.6)
Mean Plasma Glucose: 139.85 mg/dL

## 2024-03-04 LAB — HIV ANTIBODY (ROUTINE TESTING W REFLEX): HIV Screen 4th Generation wRfx: NONREACTIVE

## 2024-03-04 LAB — UREA NITROGEN, URINE: Urea Nitrogen, Ur: 761 mg/dL

## 2024-03-04 MED ORDER — INSULIN ASPART 100 UNIT/ML IJ SOLN
0.0000 [IU] | Freq: Three times a day (TID) | INTRAMUSCULAR | Status: DC
  Administered 2024-03-04: 1 [IU] via SUBCUTANEOUS
  Administered 2024-03-05: 12:00:00 2 [IU] via SUBCUTANEOUS
  Administered 2024-03-05: 18:00:00 1 [IU] via SUBCUTANEOUS
  Administered 2024-03-05: 09:00:00 3 [IU] via SUBCUTANEOUS
  Administered 2024-03-06: 08:00:00 2 [IU] via SUBCUTANEOUS
  Filled 2024-03-04: qty 2
  Filled 2024-03-04: qty 3
  Filled 2024-03-04: qty 1
  Filled 2024-03-04: qty 3

## 2024-03-04 MED ORDER — PERFLUTREN LIPID MICROSPHERE
1.0000 mL | INTRAVENOUS | Status: AC | PRN
  Administered 2024-03-04: 2 mL via INTRAVENOUS

## 2024-03-04 MED ORDER — INSULIN ASPART 100 UNIT/ML IJ SOLN: 0.0000 [IU] | Freq: Every day | INTRAMUSCULAR | Status: DC

## 2024-03-04 NOTE — Progress Notes (Signed)
 PROGRESS NOTE    James Wright  FMW:[id number] DOB: 08-Oct-1965 DOA: 03/02/2024 PCP: Paseda, Folashade R, FNP  Subjective: No acute events overnight. Seen and examined at bedside. Reports feeling better with improvement in his breathing. Reports ongoing bilateral leg edema. Tolerating oral intake without n/v. Denies constipation     Hospital Course:  58 y.o. male with medical history significant of chronic tobacco smoking, THC use, hidradenitis.  He presents to the emergency room with complaints of progressive worsening shortness of breath over the past couple of weeks.  He had previously been seen in the emergency room and was initiated on treatment for suspected acute bronchitis.  Patient was initiated on treatment with steroids and azithromycin .  Despite this treatment, patient reports no improvement.  He denies any fever, chills but admits to worsening lower extremity edema.  He also reported orthopnea and paroxysmal nocturnal dyspnea. He admitted to worsening lower extremity edema and weight gain of about 30 pounds over the past several months.  No prior diagnosis of CHF.  Denies any sick contacts.   In the ED, patient was given round of IV Lasix .  He was also found to be slightly hypoxic hence oxygen supplementation with 2 L was offered.  CT angiogram done in the ED ruled out pulm emboli.  Cardiomegaly with trace right-sided pleural effusion noted.  Bronchial narrowing with suspected tracheal broncomalacia noted.   Assessment and Plan:  58 year old gentleman presented with acute shortness of breath.  Recently treated for acute bronchitis with minimal improvement.  Symptoms most likely consistent with CHF.  Patient also has some evidence of tracheal broncomalacia with inflammatory changes.   Fluid overload state Etiology unclear, concern for new onset acute heart failure Clinically fluid overloaded Patient says he was given diuretics once a couple of weeks ago for leg edema but it  doesn't seem it he take it regularly Cont IV lasix  40mg  daily  TTE pending Venous US  bilateral legs to rule out acute DVT Monitor I/Os, daily weights Monitor on tele Monitor electrolytes and renal function Will get cardiology consult after TTE results    Acute kidney injury Complex Renal cyst Cr 1.13 < 1.31, baseline 0.6-0.8 although last lab work is very old Etiology unclear Clinically fluid overloaded Diuresis as elsewhere Bladder scan 0  Renal US  showed medical renal disease, slightly complex cystic structure in upper pole of L kidney. Will need non-emergent MRI as outpatient  Monitor I/Os, weights Monitor BMP   Presumed COPD Tracheobronchomalacia As per patient, no prior PFTs or diagnosis of COPD/emphysema/chronic bronchitis Does admit to tobacco use Stop steroids as this may worsen fluid retention No indication for antibiotics at this time. Cont albuterol  PRN   Polysubstance use disorder Patient admits to tobacco, THC and cocaine use Utox positive for cocaine and THC HIV screen negative Hep C Ab pending  Counseled on cessation  Continue Nicotine  patch  TOC consult pending  New onset diabetes HgbA1c 6.5 Start on SSI with FS ACHS Will consult diabetes coordinator   History of hydradenitis suppurativa Monitor clinically  DVT prophylaxis: enoxaparin  (LOVENOX ) injection 40 mg Start: 03/03/24 1000 SCDs Start: 03/02/24 2106  Lovenox    Code Status: Full Code  Disposition Plan: TBD pending clinical course Reason for continuing need for hospitalization: IV diuresis  Objective: Vitals:   03/03/24 2048 03/04/24 0403 03/04/24 0754 03/04/24 0825  BP: 115/86 119/77 (!) 137/99   Pulse: (!) 119 (!) 120 (!) 111   Resp: 18 20 16    Temp: (!) 97.5 F (36.4 C)  97.8 F (36.6 C) 97.6 F (36.4 C)   TempSrc: Oral Oral Oral   SpO2: 98% 95% 95% 97%  Weight:      Height:        Intake/Output Summary (Last 24 hours) at 03/04/2024 1314 Last data filed at 03/04/2024  1240 Gross per 24 hour  Intake 440 ml  Output 3400 ml  Net -2960 ml   Filed Weights   03/02/24 1719  Weight: 113.4 kg    Examination:  Physical Exam Vitals and nursing note reviewed.  Constitutional:      General: He is not in acute distress.    Appearance: He is obese. He is ill-appearing.  HENT:     Head: Normocephalic and atraumatic.  Cardiovascular:     Rate and Rhythm: Normal rate and regular rhythm.     Pulses: Normal pulses.     Heart sounds: Normal heart sounds.  Pulmonary:     Effort: Pulmonary effort is normal.     Breath sounds: Normal breath sounds.  Abdominal:     General: Bowel sounds are normal.     Palpations: Abdomen is soft.  Musculoskeletal:     Right lower leg: Edema present.     Left lower leg: Edema present.  Neurological:     Mental Status: He is alert.     Data Reviewed: I have personally reviewed following labs and imaging studies  CBC: Recent Labs  Lab 03/02/24 1805 03/03/24 0455  WBC 10.5 11.7*  HGB 15.4 15.8  HCT 48.1 49.7  MCV 91.1 94.1  PLT 298 273   Basic Metabolic Panel: Recent Labs  Lab 03/02/24 1805 03/03/24 0455 03/04/24 0634  NA 140 140 138  K 4.0 4.6 4.3  CL 104 101 105  CO2 26 30 23   GLUCOSE 148* 281* 172*  BUN 17 15 23*  CREATININE 1.02 1.31* 1.13  CALCIUM 8.7* 8.4* 8.7*   GFR: Estimated Creatinine Clearance: 97.8 mL/min (by C-G formula based on SCr of 1.13 mg/dL). Liver Function Tests: No results for input(s): AST, ALT, ALKPHOS, BILITOT, PROT, ALBUMIN in the last 168 hours. No results for input(s): LIPASE, AMYLASE in the last 168 hours. No results for input(s): AMMONIA in the last 168 hours. Coagulation Profile: No results for input(s): INR, PROTIME in the last 168 hours. Cardiac Enzymes: No results for input(s): CKTOTAL, CKMB, CKMBINDEX, TROPONINI in the last 168 hours. ProBNP, BNP (last 5 results) Recent Labs    03/02/24 1805  BNP 970.9*   HbA1C: Recent Labs     03/04/24 0634  HGBA1C 6.5*   CBG: No results for input(s): GLUCAP in the last 168 hours. Lipid Profile: No results for input(s): CHOL, HDL, LDLCALC, TRIG, CHOLHDL, LDLDIRECT in the last 72 hours. Thyroid Function Tests: No results for input(s): TSH, T4TOTAL, FREET4, T3FREE, THYROIDAB in the last 72 hours. Anemia Panel: No results for input(s): VITAMINB12, FOLATE, FERRITIN, TIBC, IRON, RETICCTPCT in the last 72 hours. Sepsis Labs: No results for input(s): PROCALCITON, LATICACIDVEN in the last 168 hours.  Recent Results (from the past 240 hours)  Resp panel by RT-PCR (RSV, Flu A&B, Covid) Anterior Nasal Swab     Status: None   Collection Time: 03/02/24  5:56 PM   Specimen: Anterior Nasal Swab  Result Value Ref Range Status   SARS Coronavirus 2 by RT PCR NEGATIVE NEGATIVE Final   Influenza A by PCR NEGATIVE NEGATIVE Final   Influenza B by PCR NEGATIVE NEGATIVE Final    Comment: (NOTE) The Xpert Xpress SARS-CoV-2/FLU/RSV plus  assay is intended as an aid in the diagnosis of influenza from Nasopharyngeal swab specimens and should not be used as a sole basis for treatment. Nasal washings and aspirates are unacceptable for Xpert Xpress SARS-CoV-2/FLU/RSV testing.  Fact Sheet for Patients: bloggercourse.com  Fact Sheet for Healthcare Providers: seriousbroker.it  This test is not yet approved or cleared by the [place]  FDA and has been authorized for detection and/or diagnosis of SARS-CoV-2 by FDA under an Emergency Use Authorization (EUA). This EUA will remain in effect (meaning this test can be used) for the duration of the COVID-19 declaration under Section 564(b)(1) of the Act, 21 U.S.C. section 360bbb-3(b)(1), unless the authorization is terminated or revoked.     Resp Syncytial Virus by PCR NEGATIVE NEGATIVE Final    Comment: (NOTE) Fact Sheet for  Patients: bloggercourse.com  Fact Sheet for Healthcare Providers: seriousbroker.it  This test is not yet approved or cleared by the [place]  FDA and has been authorized for detection and/or diagnosis of SARS-CoV-2 by FDA under an Emergency Use Authorization (EUA). This EUA will remain in effect (meaning this test can be used) for the duration of the COVID-19 declaration under Section 564(b)(1) of the Act, 21 U.S.C. section 360bbb-3(b)(1), unless the authorization is terminated or revoked.  Performed at [facility], [address], [place], [place] [address]   Respiratory (~20 pathogens) panel by PCR     Status: None   Collection Time: 03/03/24  6:39 AM   Specimen: Nasopharyngeal Swab; Respiratory  Result Value Ref Range Status   Adenovirus NOT DETECTED NOT DETECTED Final   Coronavirus 229E NOT DETECTED NOT DETECTED Final    Comment: (NOTE) The Coronavirus on the Respiratory Panel, DOES NOT test for the novel  Coronavirus (2019 nCoV)    Coronavirus HKU1 NOT DETECTED NOT DETECTED Final   Coronavirus NL63 NOT DETECTED NOT DETECTED Final   Coronavirus OC43 NOT DETECTED NOT DETECTED Final   Metapneumovirus NOT DETECTED NOT DETECTED Final   Rhinovirus / Enterovirus NOT DETECTED NOT DETECTED Final   Influenza A NOT DETECTED NOT DETECTED Final   Influenza B NOT DETECTED NOT DETECTED Final   Parainfluenza Virus 1 NOT DETECTED NOT DETECTED Final   Parainfluenza Virus 2 NOT DETECTED NOT DETECTED Final   Parainfluenza Virus 3 NOT DETECTED NOT DETECTED Final   Parainfluenza Virus 4 NOT DETECTED NOT DETECTED Final   Respiratory Syncytial Virus NOT DETECTED NOT DETECTED Final   Bordetella pertussis NOT DETECTED NOT DETECTED Final   Bordetella Parapertussis NOT DETECTED NOT DETECTED Final   Chlamydophila pneumoniae NOT DETECTED NOT DETECTED Final   Mycoplasma pneumoniae NOT DETECTED NOT DETECTED Final    Comment: Performed at  [facility], [address], [place], [place] [address]     Radiology Studies: US  RENAL Result Date: 03/03/2024 CLINICAL DATA:  AKI. EXAM: RENAL / URINARY TRACT ULTRASOUND COMPLETE COMPARISON:  None Available. FINDINGS: Right Kidney: Renal measurements: 10.7 x 5.8 x 5.4 cm = volume: 173.9 mL. Increased echogenicity is noted. No mass or hydronephrosis visualized. Left Kidney: Renal measurements: 11.5 x 6.6 x 6.0 cm = volume: 239.5 mL. Increased echogenicity is noted. No hydronephrosis is seen. There is a vague complex cystic structure in the upper pole of the left kidney measuring 1.9 x 2.0 x 1.8 cm with internal echoes. Bladder: Appears normal for degree of bladder distention. Other: None. IMPRESSION: 1. Increased renal parenchymal echogenicity bilaterally, compatible with medical renal disease. 2. Slightly complex cystic structure in the upper pole of the  left kidney with internal echoes measuring up to 2 cm. Nonemergent MRI is suggested for further evaluation. Electronically Signed   By: Leita Birmingham M.D.   On: 03/03/2024 15:00   CT Angio Chest PE W and/or Wo Contrast Result Date: 03/02/2024 CLINICAL DATA:  Shortness of breath EXAM: CT ANGIOGRAPHY CHEST WITH CONTRAST TECHNIQUE: Multidetector CT imaging of the chest was performed using the standard protocol during bolus administration of intravenous contrast. Multiplanar CT image reconstructions and MIPs were obtained to evaluate the vascular anatomy. RADIATION DOSE REDUCTION: This exam was performed according to the departmental dose-optimization program which includes automated exposure control, adjustment of the mA and/or kV according to patient size and/or use of iterative reconstruction technique. CONTRAST:  75mL OMNIPAQUE  IOHEXOL  350 MG/ML SOLN COMPARISON:  Chest x-ray 03/02/2024 FINDINGS: Cardiovascular: Satisfactory opacification of the pulmonary arteries to the segmental level. No evidence of pulmonary embolism. Nonaneurysmal aorta. Mild  atherosclerosis. Cardiomegaly. No significant pericardial effusion Mediastinum/Nodes: Mild bowing of the posterior trachea and slight narrowed appearance of the main bronchi. Mild bilateral bronchial wall thickening. No thyroid mass. Borderline AP window lymph nodes measuring up to 9 mm. Right low paratracheal nodes measuring up to 14 mm. Left precarinal node measuring 13 mm. Small hilar nodes, on the right measuring up to 12 mm and on the left measuring up to 11 mm. Small hiatal hernia and mild circumferential distal esophageal thickening. Lungs/Pleura: No acute focal airspace disease. No pneumothorax. Mild subpleural reticulation along the anterior upper lobes. Trace right-sided pleural effusion. Upper Abdomen: No definite acute finding, respiratory motion degradation. Indistinct appearing gallbladder. Musculoskeletal: No acute osseous abnormality. Mild generalized subcutaneous edema. Small amount of fluid and edema superficial to the sternum Review of the MIP images confirms the above findings. IMPRESSION: 1. Negative for acute pulmonary embolus. 2. Cardiomegaly. Trace right-sided pleural effusion. 3. Mild mediastinal and hilar adenopathy, nonspecific in appearance. Consider short-term CT follow-up to assess for interval change. 4. Slight bowing of the posterior trachea and slightly narrowed appearance of the bilateral bronchi, correlate for tracheal malacia/tracheal bronchomalacia. Mild bilateral bronchial wall thickening may be seen with airways inflammatory process 5. Small hiatal hernia and mild circumferential distal esophageal thickening, question reflux or esophagitis. 6. Indistinct appearing gallbladder, appearance suspected to be related to motion artifact, if there are symptoms referable to the right upper quadrant, ultrasound could be performed. 7. Aortic atherosclerosis. Aortic Atherosclerosis (ICD10-I70.0). Electronically Signed   By: Luke Bun M.D.   On: 03/02/2024 20:29   DG Chest Portable 1  View Result Date: 03/02/2024 EXAM: 1 VIEW(S) XRAY OF THE CHEST 03/02/2024 06:04:00 PM COMPARISON: 02/21/2024 CLINICAL HISTORY: SOB FINDINGS: LUNGS AND PLEURA: Stable interstitial prominence. No pleural effusion. No pneumothorax. HEART AND MEDIASTINUM: Cardiomegaly, unchanged. BONES AND SOFT TISSUES: No acute osseous abnormality. IMPRESSION: 1. Cardiomegaly, unchanged. 2. Stable interstitial prominence. Electronically signed by: Greig Pique MD 03/02/2024 06:59 PM EST RP Workstation: [id number]    Scheduled Meds:  enoxaparin  (LOVENOX ) injection  40 mg Subcutaneous Q24H   fluticasone   2 spray Each Nare Daily   furosemide   40 mg Intravenous Daily   nicotine   14 mg Transdermal Daily   sodium chloride  flush  3 mL Intravenous Q12H   Continuous Infusions:   LOS: 2 days   Norval Bar, MD  [facility]  03/04/2024, 1:14 PM

## 2024-03-04 NOTE — Progress Notes (Signed)
°  Echocardiogram 2D Echocardiogram has been performed.  James Wright 03/04/2024, 2:55 PM

## 2024-03-04 NOTE — Hospital Course (Signed)
 58 y.o. male with medical history significant of chronic tobacco smoking, THC use, hidradenitis.  He presents to the emergency room with complaints of progressive worsening shortness of breath over the past couple of weeks.  He had previously been seen in the emergency room and was initiated on treatment for suspected acute bronchitis.  Patient was initiated on treatment with steroids and azithromycin .  Despite this treatment, patient reports no improvement.  He denies any fever, chills but admits to worsening lower extremity edema.  He also reported orthopnea and paroxysmal nocturnal dyspnea. He admitted to worsening lower extremity edema and weight gain of about 30 pounds over the past several months.  No prior diagnosis of CHF.  Denies any sick contacts.   In the ED, patient was given round of IV Lasix .  He was also found to be slightly hypoxic hence oxygen supplementation with 2 L was offered.  CT angiogram done in the ED ruled out pulm emboli.  Cardiomegaly with trace right-sided pleural effusion noted.  Bronchial narrowing with suspected tracheal broncomalacia noted.

## 2024-03-04 NOTE — Plan of Care (Signed)
?  Problem: Activity: ?Goal: Ability to tolerate increased activity will improve ?Outcome: Progressing ?  ?Problem: Respiratory: ?Goal: Ability to maintain a clear airway will improve ?Outcome: Progressing ?Goal: Ability to maintain adequate ventilation will improve ?Outcome: Progressing ?  ?

## 2024-03-04 NOTE — Progress Notes (Signed)
 2D echo attempted. RT getting treatment, RN getting IV. Will try later

## 2024-03-05 ENCOUNTER — Other Ambulatory Visit (HOSPITAL_COMMUNITY): Payer: Self-pay

## 2024-03-05 ENCOUNTER — Encounter (HOSPITAL_COMMUNITY): Payer: Self-pay | Admitting: Internal Medicine

## 2024-03-05 ENCOUNTER — Inpatient Hospital Stay (HOSPITAL_COMMUNITY)

## 2024-03-05 ENCOUNTER — Telehealth (HOSPITAL_COMMUNITY): Payer: Self-pay

## 2024-03-05 DIAGNOSIS — R609 Edema, unspecified: Secondary | ICD-10-CM

## 2024-03-05 DIAGNOSIS — I5021 Acute systolic (congestive) heart failure: Secondary | ICD-10-CM

## 2024-03-05 DIAGNOSIS — I429 Cardiomyopathy, unspecified: Secondary | ICD-10-CM

## 2024-03-05 LAB — BASIC METABOLIC PANEL WITH GFR
Anion gap: 12 (ref 5–15)
BUN: 23 mg/dL — ABNORMAL HIGH (ref 6–20)
CO2: 25 mmol/L (ref 22–32)
Calcium: 8.5 mg/dL — ABNORMAL LOW (ref 8.9–10.3)
Chloride: 104 mmol/L (ref 98–111)
Creatinine, Ser: 1.11 mg/dL (ref 0.61–1.24)
GFR, Estimated: >60 mL/min (ref >60)
Glucose, Bld: 174 mg/dL — ABNORMAL HIGH (ref 70–99)
Potassium: 3.9 mmol/L (ref 3.5–5.1)
Sodium: 141 mmol/L (ref 135–145)

## 2024-03-05 LAB — GLUCOSE, CAPILLARY
Glucose-Capillary: 209 mg/dL — ABNORMAL HIGH (ref 70–99)
Glucose-Capillary: 181 mg/dL — ABNORMAL HIGH (ref 70–99)
Glucose-Capillary: 146 mg/dL — ABNORMAL HIGH (ref 70–99)
Glucose-Capillary: 168 mg/dL — ABNORMAL HIGH (ref 70–99)

## 2024-03-05 LAB — MAGNESIUM: Magnesium: 2 mg/dL (ref 1.7–2.4)

## 2024-03-05 LAB — TSH: TSH: 4.769 u[IU]/mL — ABNORMAL HIGH (ref 0.350–4.500)

## 2024-03-05 MED ORDER — SPIRONOLACTONE 12.5 MG HALF TABLET
12.5000 mg | ORAL_TABLET | Freq: Every day | ORAL | Status: DC
  Administered 2024-03-05 – 2024-03-06 (×2): 12.5 mg via ORAL
  Filled 2024-03-05 (×2): qty 1

## 2024-03-05 MED ORDER — EMPAGLIFLOZIN 10 MG PO TABS
10.0000 mg | ORAL_TABLET | Freq: Every day | ORAL | Status: DC
  Administered 2024-03-05 – 2024-03-06 (×2): 10 mg via ORAL
  Filled 2024-03-05 (×2): qty 1

## 2024-03-05 MED ORDER — LIVING WELL WITH DIABETES BOOK
Freq: Once | Status: AC
  Filled 2024-03-05: qty 1

## 2024-03-05 MED ORDER — FUROSEMIDE 10 MG/ML IJ SOLN
40.0000 mg | Freq: Two times a day (BID) | INTRAMUSCULAR | Status: DC
  Administered 2024-03-05: 18:00:00 40 mg via INTRAVENOUS
  Filled 2024-03-05 (×2): qty 4

## 2024-03-05 MED ORDER — SACUBITRIL-VALSARTAN 24-26 MG PO TABS
1.0000 | ORAL_TABLET | Freq: Two times a day (BID) | ORAL | Status: DC
  Administered 2024-03-05 – 2024-03-06 (×3): 1 via ORAL
  Filled 2024-03-05 (×4): qty 1

## 2024-03-05 NOTE — Plan of Care (Signed)
°  Problem: Education: Goal: Knowledge of disease or condition will improve Outcome: Progressing Goal: Knowledge of the prescribed therapeutic regimen will improve Outcome: Progressing Goal: Individualized Educational Video(s) Outcome: Progressing   Problem: Activity: Goal: Ability to tolerate increased activity will improve Outcome: Progressing Goal: Will verbalize the importance of balancing activity with adequate rest periods Outcome: Progressing   Problem: Respiratory: Goal: Ability to maintain a clear airway will improve Outcome: Progressing Goal: Levels of oxygenation will improve Outcome: Progressing Goal: Ability to maintain adequate ventilation will improve Outcome: Progressing   Problem: Education: Goal: Ability to describe self-care measures that may prevent or decrease complications (Diabetes Survival Skills Education) will improve Outcome: Progressing Goal: Individualized Educational Video(s) Outcome: Progressing   Problem: Coping: Goal: Ability to adjust to condition or change in health will improve Outcome: Progressing   Problem: Fluid Volume: Goal: Ability to maintain a balanced intake and output will improve Outcome: Progressing   Problem: Health Behavior/Discharge Planning: Goal: Ability to identify and utilize available resources and services will improve Outcome: Progressing Goal: Ability to manage health-related needs will improve Outcome: Progressing   Problem: Metabolic: Goal: Ability to maintain appropriate glucose levels will improve Outcome: Progressing   Problem: Nutritional: Goal: Maintenance of adequate nutrition will improve Outcome: Progressing Goal: Progress toward achieving an optimal weight will improve Outcome: Progressing   Problem: Skin Integrity: Goal: Risk for impaired skin integrity will decrease Outcome: Progressing   Problem: Tissue Perfusion: Goal: Adequacy of tissue perfusion will improve Outcome: Progressing    Problem: Education: Goal: Knowledge of General Education information will improve Description: Including pain rating scale, medication(s)/side effects and non-pharmacologic comfort measures Outcome: Progressing   Problem: Health Behavior/Discharge Planning: Goal: Ability to manage health-related needs will improve Outcome: Progressing   Problem: Clinical Measurements: Goal: Ability to maintain clinical measurements within normal limits will improve Outcome: Progressing Goal: Will remain free from infection Outcome: Progressing Goal: Diagnostic test results will improve Outcome: Progressing Goal: Respiratory complications will improve Outcome: Progressing Goal: Cardiovascular complication will be avoided Outcome: Progressing   Problem: Activity: Goal: Risk for activity intolerance will decrease Outcome: Progressing   Problem: Nutrition: Goal: Adequate nutrition will be maintained Outcome: Progressing   Problem: Coping: Goal: Level of anxiety will decrease Outcome: Progressing   Problem: Elimination: Goal: Will not experience complications related to bowel motility Outcome: Progressing Goal: Will not experience complications related to urinary retention Outcome: Progressing   Problem: Pain Managment: Goal: General experience of comfort will improve and/or be controlled Outcome: Progressing   Problem: Safety: Goal: Ability to remain free from injury will improve Outcome: Progressing   Problem: Skin Integrity: Goal: Risk for impaired skin integrity will decrease Outcome: Progressing

## 2024-03-05 NOTE — TOC CAGE-AID Note (Signed)
 Transition of Care [facility] - CAGE-AID Screening   Patient Details  Name: James Wright MRN: [id number] Date of Birth: 14-Dec-1965  Transition of Care [facility]) CM/SW Contact:    Lendia Dais, LCSWA Phone Number: 03/05/2024, 1:18 PM   Clinical Narrative: CSW spoke to pt at bedside and introduced self and role. CSW informed pt of the CAGE Aid consult and explained the purpose of the assessment. Pt was agreeable to participating.  D/t the pt answering no to all questions, no resources were offered.    CAGE-AID Screening:    Have You Ever Felt You Ought to Cut Down on Your Drinking or Drug Use?: No Have People Annoyed You By Critizing Your Drinking Or Drug Use?: No Have You Felt Bad Or Guilty About Your Drinking Or Drug Use?: No Have You Ever Had a Drink or Used Drugs First Thing In The Morning to Steady Your Nerves or to Get Rid of a Hangover?: No CAGE-AID Score: 0  Substance Abuse Education Offered: No  Substance abuse interventions: SDOH Screening

## 2024-03-05 NOTE — Progress Notes (Signed)
 BLE venous duplex has been completed.   Results can be found under chart review under CV PROC. 03/05/2024 10:00 AM Djuana Littleton RVT, RDMS

## 2024-03-05 NOTE — Consult Note (Signed)
 Cardiology Consultation   Patient ID: KYLOR VALVERDE MRN: [id number]; DOB: [birth year]  Admit date: 03/02/2024 Date of Consult: 03/05/2024  PCP:  Paseda, Folashade R, FNP   [facility] Providers Cardiologist:  New   Patient Profile: James Wright is a 58 y.o. male with a hx of left eye blindness and substance abuse who is being seen 03/05/2024 for the evaluation of acute systolic congestive heart failure at the request of Norval Bar MD.  History of Present Illness: No prior cardiac history.  Patient states that for the past 2 weeks he has had progressive dyspnea on exertion but denies orthopnea, PND, chest pain or syncope.  He has also noticed bilateral lower extremity edema and weight gain of 40 pounds.  He was admitted and cardiology asked to evaluate.   Past Medical History:  Diagnosis Date   Blindness of left eye    Hidradenitis    Marijuana smoker 12/24/2022   Tobacco use disorder, moderate, dependence 02/16/2017    Past Surgical History:  Procedure Laterality Date   BACK SURGERY       Scheduled Meds:  enoxaparin  (LOVENOX ) injection  40 mg Subcutaneous Q24H   fluticasone   2 spray Each Nare Daily   furosemide   40 mg Intravenous Daily   insulin  aspart  0-5 Units Subcutaneous QHS   insulin  aspart  0-9 Units Subcutaneous TID WC   nicotine   14 mg Transdermal Daily   sodium chloride  flush  3 mL Intravenous Q12H   Continuous Infusions:  PRN Meds: acetaminophen  **OR** acetaminophen , albuterol , ondansetron  **OR** ondansetron  (ZOFRAN ) IV, senna-docusate, sodium chloride  flush  Allergies:   Allergies[1]  Social History:   Social History   Socioeconomic History   Marital status: Divorced    Spouse name: Not on file   Number of children: 1   Years of education: Not on file   Highest education level: Not on file  Occupational History   Not on file  Tobacco Use   Smoking status: Every Day    Current packs/day: 1.00    Types: Cigarettes    Smokeless tobacco: Never  Vaping Use   Vaping status: Never Used  Substance and Sexual Activity   Alcohol use: Not Currently    Comment: occ   Drug use: Yes    Types: Marijuana, Cocaine    Comment: daily   Sexual activity: Yes    Birth control/protection: Condom  Other Topics Concern   Not on file  Social History Narrative   Lives home alone    Social Drivers of Health   Tobacco Use: High Risk (03/05/2024)   Patient History    Smoking Tobacco Use: Every Day    Smokeless Tobacco Use: Never    Passive Exposure: Not on file  Financial Resource Strain: Not on file  Food Insecurity: No Food Insecurity (03/03/2024)   Epic    Worried About Programme Researcher, Broadcasting/film/video in the Last Year: Never true    Ran Out of Food in the Last Year: Never true  Transportation Needs: No Transportation Needs (03/03/2024)   Epic    Lack of Transportation (Medical): No    Lack of Transportation (Non-Medical): No  Physical Activity: Not on file  Stress: Not on file  Social Connections: Not on file  Intimate Partner Violence: Not At Risk (03/03/2024)   Epic    Fear of Current or Ex-Partner: No    Emotionally Abused: No    Physically Abused: No    Sexually Abused: No  Depression (PHQ2-9): Low  Risk (01/10/2024)   Depression (PHQ2-9)    PHQ-2 Score: 0  Alcohol Screen: Not on file  Housing: Low Risk (03/03/2024)   Epic    Unable to Pay for Housing in the Last Year: No    Number of Times Moved in the Last Year: 0    Homeless in the Last Year: No  Utilities: Not At Risk (03/03/2024)   Epic    Threatened with loss of utilities: No  Health Literacy: Not on file    Family History:    Family History  Problem Relation Age of Onset   CAD Father    Colon cancer Neg Hx    Skin cancer Neg Hx      ROS:  Please see the history of present illness.  Had the flu approximately 3 weeks ago by his report.  No hemoptysis. All other ROS reviewed and negative.     Physical Exam/Data: Vitals:   03/04/24 1640  03/04/24 2213 03/05/24 0412 03/05/24 0814  BP: (!) 128/97 110/83 131/86 (!) 142/100  Pulse: (!) 121 64 (!) 101 68  Resp: 16 18 18 19   Temp: 97.7 F (36.5 C) 98.6 F (37 C) 97.7 F (36.5 C) 98.3 F (36.8 C)  TempSrc:  Oral Oral   SpO2: 95% 97% 96% 96%  Weight:      Height:        Intake/Output Summary (Last 24 hours) at 03/05/2024 1101 Last data filed at 03/05/2024 1000 Gross per 24 hour  Intake 620 ml  Output 2725 ml  Net -2105 ml      03/02/2024    5:19 PM 01/10/2024    2:13 PM 12/24/2022    8:02 AM  Last 3 Weights  Weight (lbs) 250 lb 245 lb 216 lb  Weight (kg) 113.399 kg 111.131 kg 97.977 kg     Body mass index is 32.1 kg/m.  General:  Well nourished, well developed, in no acute distress HEENT: normal Neck: positive JVD Vascular: No carotid bruits; Distal pulses 2+ bilaterally Cardiac:  normal S1, S2; RRR; no murmur  Lungs:  clear to auscultation bilaterally, no wheezing, rhonchi or rales  Abd: soft, nontender, no hepatomegaly  Ext: 2-3+ edema Musculoskeletal:  No deformities, BUE and BLE strength normal and equal Skin: warm and dry  Neuro:  CNs 2-12 intact, no focal abnormalities noted Psych:  Normal affect   EKG:  The EKG was personally reviewed and demonstrates: Sinus tachycardia Telemetry:  Telemetry was personally reviewed and demonstrates: Sinus tachycardia  Laboratory Data: High Sensitivity Troponin:   Recent Labs  Lab 03/02/24 1805 03/02/24 2005  TROPONINIHS 60* 68*   Chemistry Recent Labs  Lab 03/03/24 0455 03/04/24 0634 03/05/24 0403  NA 140 138 141  K 4.6 4.3 3.9  CL 101 105 104  CO2 30 23 25   GLUCOSE 281* 172* 174*  BUN 15 23* 23*  CREATININE 1.31* 1.13 1.11  CALCIUM 8.4* 8.7* 8.5*  MG  --   --  2.0  GFRNONAA >60 >60 >60  ANIONGAP 9 10 12      Hematology Recent Labs  Lab 03/02/24 1805 03/03/24 0455  WBC 10.5 11.7*  RBC 5.28 5.28  HGB 15.4 15.8  HCT 48.1 49.7  MCV 91.1 94.1  MCH 29.2 29.9  MCHC 32.0 31.8  RDW 15.0  15.2  PLT 298 273    BNP Recent Labs  Lab 03/02/24 1805  BNP 970.9*      Radiology/Studies:  VAS US  LOWER EXTREMITY VENOUS (DVT) Result Date: 03/05/2024  Lower Venous DVT Study Patient Name:  James Wright  Date of Exam:   03/05/2024 Medical Rec #: [id number]          Accession #:    [id number] Date of Birth: 1966-01-28           Patient Gender: M Patient Age:   58 years Exam Location:  [facility] Procedure:      VAS US  LOWER EXTREMITY VENOUS (DVT) Referring Phys: NORVAL BAR --------------------------------------------------------------------------------  Indications: Edema.  Comparison Study: No previous exams Performing Technologist: Jody Hill RVT, RDMS  Examination Guidelines: A complete evaluation includes B-mode imaging, spectral Doppler, color Doppler, and power Doppler as needed of all accessible portions of each vessel. Bilateral testing is considered an integral part of a complete examination. Limited examinations for reoccurring indications may be performed as noted. The reflux portion of the exam is performed with the patient in reverse Trendelenburg.  +---------+---------------+---------+-----------+----------+--------------+ RIGHT    CompressibilityPhasicitySpontaneityPropertiesThrombus Aging +---------+---------------+---------+-----------+----------+--------------+ CFV      Full           No       Yes                                 +---------+---------------+---------+-----------+----------+--------------+ SFJ      Full                                                        +---------+---------------+---------+-----------+----------+--------------+ FV Prox  Full           No       Yes                                 +---------+---------------+---------+-----------+----------+--------------+ FV Mid   Full           No       Yes                                 +---------+---------------+---------+-----------+----------+--------------+ FV  DistalFull           No       Yes                                 +---------+---------------+---------+-----------+----------+--------------+ PFV      Full                                                        +---------+---------------+---------+-----------+----------+--------------+ POP      Full           No       Yes                                 +---------+---------------+---------+-----------+----------+--------------+ PTV      Full                                                        +---------+---------------+---------+-----------+----------+--------------+  PERO     Full                                                        +---------+---------------+---------+-----------+----------+--------------+ pulsatile dopple waveforms  +---------+---------------+---------+-----------+----------+--------------+ LEFT     CompressibilityPhasicitySpontaneityPropertiesThrombus Aging +---------+---------------+---------+-----------+----------+--------------+ CFV      Full           No       Yes                                 +---------+---------------+---------+-----------+----------+--------------+ SFJ      Full                                                        +---------+---------------+---------+-----------+----------+--------------+ FV Prox  Full           No       Yes                                 +---------+---------------+---------+-----------+----------+--------------+ FV Mid   Full           No       Yes                                 +---------+---------------+---------+-----------+----------+--------------+ FV DistalFull           No       Yes                                 +---------+---------------+---------+-----------+----------+--------------+ PFV      Full                                                        +---------+---------------+---------+-----------+----------+--------------+ POP      Full            No       Yes                                 +---------+---------------+---------+-----------+----------+--------------+ PTV      Full                                                        +---------+---------------+---------+-----------+----------+--------------+ PERO     Full                                                        +---------+---------------+---------+-----------+----------+--------------+  pulsatile doppler waveforms   Summary: BILATERAL: - No evidence of deep vein thrombosis seen in the lower extremities, bilaterally. - RIGHT: - No cystic structure found in the popliteal fossa.  LEFT: - A cystic structure is found in the popliteal fossa 7.96 x 2.54 x 5.52 cm).  *See table(s) above for measurements and observations.    Preliminary    ECHOCARDIOGRAM COMPLETE Result Date: 03/04/2024    ECHOCARDIOGRAM REPORT   Patient Name:   James Wright Date of Exam: 03/04/2024 Medical Rec #:  [id number]         Height:       74.0 in Accession #:    [id number]        Weight:       250.0 lb Date of Birth:  March 04, 1966          BSA:          2.390 m Patient Age:    58 years          BP:           137/99 mmHg Patient Gender: M                 HR:           118 bpm. Exam Location:  Inpatient Procedure: 2D Echo, Cardiac Doppler, Color Doppler and Intracardiac            Opacification Agent (Both Spectral and Color Flow Doppler were            utilized during procedure). Indications:    I50.40* Unspecified combined systolic (congestive) and diastolic                 (congestive) heart failure  History:        Patient has no prior history of Echocardiogram examinations.                 COPD, Signs/Symptoms:Edema, Shortness of Breath and Dyspnea;                 Risk Factors:Current Smoker.  Sonographer:    Ellouise Mose RDCS Referring Phys: [id number] MAUDE MARLA DART  Sonographer Comments: Technically difficult study due to poor echo windows. Patient moving throughout exam, could not stay awake.  IMPRESSIONS  1. No apical thrombus with Definity  contrast. Left ventricular ejection fraction, by estimation, is 20 to 25%. The left ventricle has severely decreased function. The left ventricle demonstrates global hypokinesis. The left ventricular internal cavity size was moderately dilated. There is mild left ventricular hypertrophy. Left ventricular diastolic parameters are indeterminate.  2. Right ventricular systolic function is low normal. The right ventricular size is normal. There is normal pulmonary artery systolic pressure. The estimated right ventricular systolic pressure is 31.2 mmHg.  3. Left atrial size was moderately dilated.  4. Right atrial size was moderately dilated.  5. The mitral valve is grossly normal. Trivial mitral valve regurgitation.  6. Tricuspid valve regurgitation is mild to moderate.  7. The aortic valve is tricuspid. Aortic valve regurgitation is mild. Aortic valve sclerosis/calcification is present, without any evidence of aortic stenosis.  8. The inferior vena cava is dilated in size with <50% respiratory variability, suggesting right atrial pressure of 15 mmHg. Comparison(s): No prior Echocardiogram. FINDINGS  Left Ventricle: No apical thrombus with Definity  contrast. Left ventricular ejection fraction, by estimation, is 20 to 25%. The left ventricle has severely decreased function. The left ventricle demonstrates global hypokinesis. Definity  contrast agent was given IV to delineate the left  ventricular endocardial borders. The left ventricular internal cavity size was moderately dilated. There is mild left ventricular hypertrophy. Left ventricular diastolic parameters are indeterminate. Right Ventricle: The right ventricular size is normal. No increase in right ventricular wall thickness. Right ventricular systolic function is low normal. There is normal pulmonary artery systolic pressure. The tricuspid regurgitant velocity is 2.01 m/s,  and with an assumed right atrial pressure  of 15 mmHg, the estimated right ventricular systolic pressure is 31.2 mmHg. Left Atrium: Left atrial size was moderately dilated. Right Atrium: Right atrial size was moderately dilated. Pericardium: There is no evidence of pericardial effusion. Mitral Valve: The mitral valve is grossly normal. Trivial mitral valve regurgitation. Tricuspid Valve: The tricuspid valve is grossly normal. Tricuspid valve regurgitation is mild to moderate. Aortic Valve: The aortic valve is tricuspid. Aortic valve regurgitation is mild. Aortic valve sclerosis/calcification is present, without any evidence of aortic stenosis. Pulmonic Valve: The pulmonic valve was normal in structure. Pulmonic valve regurgitation is not visualized. Aorta: The aortic root and ascending aorta are structurally normal, with no evidence of dilitation. Venous: The inferior vena cava is dilated in size with less than 50% respiratory variability, suggesting right atrial pressure of 15 mmHg. IAS/Shunts: No atrial level shunt detected by color flow Doppler.  LEFT VENTRICLE PLAX 2D LVIDd:         6.30 cm LVIDs:         5.30 cm LV PW:         1.20 cm LV IVS:        1.10 cm LVOT diam:     2.30 cm LV SV:         72 LV SV Index:   30 LVOT Area:     4.15 cm  LV Volumes (MOD) LV vol d, MOD A2C: 226.0 ml LV vol d, MOD A4C: 143.0 ml LV vol s, MOD A2C: 193.0 ml LV vol s, MOD A4C: 135.0 ml LV SV MOD A2C:     33.0 ml LV SV MOD A4C:     143.0 ml LV SV MOD BP:      14.6 ml RIGHT VENTRICLE             IVC RV S prime:     10.70 cm/s  IVC diam: 2.90 cm TAPSE (M-mode): 1.4 cm LEFT ATRIUM             Index        RIGHT ATRIUM           Index LA diam:        5.00 cm 2.09 cm/m   RA Area:     24.50 cm LA Vol (A2C):   85.5 ml 35.78 ml/m  RA Volume:   85.60 ml  35.82 ml/m LA Vol (A4C):   85.5 ml 35.78 ml/m LA Biplane Vol: 90.1 ml 37.70 ml/m  AORTIC VALVE LVOT Vmax:   110.90 cm/s LVOT Vmean:  78.767 cm/s LVOT VTI:    0.172 m  AORTA Ao Root diam: 3.40 cm Ao Asc diam:  3.40 cm MITRAL  VALVE                TRICUSPID VALVE MV Area (PHT): 4.49 cm     TR Peak grad:   16.2 mmHg MV Decel Time: 169 msec     TR Vmax:        201.00 cm/s MV E velocity: 116.00 cm/s  SHUNTS                             Systemic VTI:  0.17 m                             Systemic Diam: 2.30 cm Vinie Maxcy MD Electronically signed by Vinie Maxcy MD Signature Date/Time: 03/04/2024/4:16:09 PM    Final    US  RENAL Result Date: 03/03/2024 CLINICAL DATA:  AKI. EXAM: RENAL / URINARY TRACT ULTRASOUND COMPLETE COMPARISON:  None Available. FINDINGS: Right Kidney: Renal measurements: 10.7 x 5.8 x 5.4 cm = volume: 173.9 mL. Increased echogenicity is noted. No mass or hydronephrosis visualized. Left Kidney: Renal measurements: 11.5 x 6.6 x 6.0 cm = volume: 239.5 mL. Increased echogenicity is noted. No hydronephrosis is seen. There is a vague complex cystic structure in the upper pole of the left kidney measuring 1.9 x 2.0 x 1.8 cm with internal echoes. Bladder: Appears normal for degree of bladder distention. Other: None. IMPRESSION: 1. Increased renal parenchymal echogenicity bilaterally, compatible with medical renal disease. 2. Slightly complex cystic structure in the upper pole of the left kidney with internal echoes measuring up to 2 cm. Nonemergent MRI is suggested for further evaluation. Electronically Signed   By: Leita Birmingham M.D.   On: 03/03/2024 15:00   CT Angio Chest PE W and/or Wo Contrast Result Date: 03/02/2024 CLINICAL DATA:  Shortness of breath EXAM: CT ANGIOGRAPHY CHEST WITH CONTRAST TECHNIQUE: Multidetector CT imaging of the chest was performed using the standard protocol during bolus administration of intravenous contrast. Multiplanar CT image reconstructions and MIPs were obtained to evaluate the vascular anatomy. RADIATION DOSE REDUCTION: This exam was performed according to the departmental dose-optimization program which includes automated exposure control, adjustment of the mA  and/or kV according to patient size and/or use of iterative reconstruction technique. CONTRAST:  75mL OMNIPAQUE  IOHEXOL  350 MG/ML SOLN COMPARISON:  Chest x-ray 03/02/2024 FINDINGS: Cardiovascular: Satisfactory opacification of the pulmonary arteries to the segmental level. No evidence of pulmonary embolism. Nonaneurysmal aorta. Mild atherosclerosis. Cardiomegaly. No significant pericardial effusion Mediastinum/Nodes: Mild bowing of the posterior trachea and slight narrowed appearance of the main bronchi. Mild bilateral bronchial wall thickening. No thyroid mass. Borderline AP window lymph nodes measuring up to 9 mm. Right low paratracheal nodes measuring up to 14 mm. Left precarinal node measuring 13 mm. Small hilar nodes, on the right measuring up to 12 mm and on the left measuring up to 11 mm. Small hiatal hernia and mild circumferential distal esophageal thickening. Lungs/Pleura: No acute focal airspace disease. No pneumothorax. Mild subpleural reticulation along the anterior upper lobes. Trace right-sided pleural effusion. Upper Abdomen: No definite acute finding, respiratory motion degradation. Indistinct appearing gallbladder. Musculoskeletal: No acute osseous abnormality. Mild generalized subcutaneous edema. Small amount of fluid and edema superficial to the sternum Review of the MIP images confirms the above findings. IMPRESSION: 1. Negative for acute pulmonary embolus. 2. Cardiomegaly. Trace right-sided pleural effusion. 3. Mild mediastinal and hilar adenopathy, nonspecific in appearance. Consider short-term CT follow-up to assess for interval change. 4. Slight bowing of the posterior trachea and slightly narrowed appearance of the bilateral bronchi, correlate for tracheal malacia/tracheal bronchomalacia. Mild bilateral bronchial wall thickening may be seen with airways inflammatory process 5. Small hiatal hernia and mild circumferential distal esophageal thickening, question reflux or esophagitis. 6.  Indistinct appearing gallbladder, appearance suspected to be related to motion artifact, if  there are symptoms referable to the right upper quadrant, ultrasound could be performed. 7. Aortic atherosclerosis. Aortic Atherosclerosis (ICD10-I70.0). Electronically Signed   By: Luke Bun M.D.   On: 03/02/2024 20:29   DG Chest Portable 1 View Result Date: 03/02/2024 EXAM: 1 VIEW(S) XRAY OF THE CHEST 03/02/2024 06:04:00 PM COMPARISON: 02/21/2024 CLINICAL HISTORY: SOB FINDINGS: LUNGS AND PLEURA: Stable interstitial prominence. No pleural effusion. No pneumothorax. HEART AND MEDIASTINUM: Cardiomegaly, unchanged. BONES AND SOFT TISSUES: No acute osseous abnormality. IMPRESSION: 1. Cardiomegaly, unchanged. 2. Stable interstitial prominence. Electronically signed by: Greig Pique MD 03/02/2024 06:59 PM EST RP Workstation: [id number]     Assessment and Plan: Acute systolic congestive heart failure-patient is volume overloaded on examination.  Will increase Lasix  to 40 mg IV twice daily.  Add spironolactone  12.5 mg daily and Jardiance  10 mg daily.  Follow renal function closely. Cardiomyopathy-etiology unclear.  Does have elevated hemoglobin A1c and history of tobacco use.  Also with family history of coronary disease in his father.  He will ultimately require cardiac catheterization to rule out coronary artery disease.  Will add Entresto  24/26 twice daily.  Add beta-blocker later once CHF improves.  Check TSH.  He denies alcohol use. Minimally elevated troponin-not consistent with acute coronary syndrome. Elevated hemoglobin A1c-likely with newly diagnosed diabetes mellitus. Substance abuse-drug screen positive for marijuana and cocaine.  We discussed the importance of avoiding. Renal lesion-Will need follow-up MRI electively. Mediastinal/hilar adenopathy-will need follow-up CT with primary care as an outpatient.    [place]  Heart Association (NYHA) Functional Class NYHA Class III       For questions  or updates, please contact [facility] Please consult [email or web address] for contact info under      Signed, Redell Shallow, MD  03/05/2024 11:01 AM     [1] No Known Allergies

## 2024-03-05 NOTE — Telephone Encounter (Signed)
 Pharmacy Patient Advocate Encounter  Insurance verification completed.    The patient is insured through [facility]. Patient has Medicare and is not eligible for a copay card, but may be able to apply for patient assistance or Medicare RX Payment Plan (Patient Must reach out to their plan, if eligible for payment plan), if available.    Ran test claim for Entresto  24-26mg  tablet and the current 30 day co-pay is $0.   This test claim was processed through [employer]- copay amounts may vary at other pharmacies due to [employer], or as the patient moves through the different stages of their insurance plan.

## 2024-03-05 NOTE — Care Management Important Message (Signed)
 Important Message  Patient Details  Name: James Wright MRN: [id number] Date of Birth: [birth year]   Important Message Given:  Yes - Medicare IM     Claretta Deed 03/05/2024, 4:30 PM

## 2024-03-05 NOTE — Progress Notes (Signed)
° °  Heart Failure Stewardship Pharmacist Progress Note   PCP: Paseda, Folashade R, FNP PCP-Cardiologist: None    HPI:  58 yo M with PMH of tobacco use, THC use, hidradenitis, and left eye blindness.   Presented to the ED on 12/12 with shortness of breath, orthopnea, PND, LE edema, and 30 lb weight gain. Was seen previously and treated for bronchitis but had no improvement. UDS positive for cocaine and THC. CXR with cardiomegaly. CTA negative for PE. ECHO 12/14 with LVEF 20-25%, global hypokinesis, RV low normal. Cardiology consulted. Possible cath before discharge, tentative 12/17.   Denies shortness of breath or chest pain. Still with 1+ LE edema. Reviewed changes to GDMT and goals of therapy. Re-explained the importance of cath.   Current HF Medications: Diuretic: furosemide  40 mg IV daily ACE/ARB/ARNI: Entresto  24/26 mg BID MRA: spironolactone  12.5 mg daily  Prior to admission HF Medications: Diuretic: furosemide  20 mg daily  Pertinent Lab Values: Serum creatinine 1.28, BUN 24, Potassium 4.2, Sodium 142, BNP 970.9, Magnesium 2.0, A1c 6.5   Vital Signs: Weight: 250 lbs (admission weight: 250 lbs) Blood pressure: 110/80s  Heart rate: 110s  I/O: net -2.7L yesterday; net -11.2L since admission  Medication Assistance / Insurance Benefits Check: Does the patient have prescription insurance?  Yes Type of insurance plan: [place] Medicaid  Outpatient Pharmacy:  Prior to admission [facility] Is the patient willing to use [facility] TOC pharmacy at discharge? Yes Is the patient willing to transition their outpatient pharmacy to utilize a [facility] outpatient pharmacy?   Pending    Assessment: 1. Acute systolic CHF (LVEF 20-25%), due to unknown etiology. Cath planned 12/17. NYHA class III symptoms. - Agree with reducing to furosemide  40 mg IV daily. Strict I/Os and daily weights. Keep K>4 and Mg>2. - Consider adding BB once euvolemic after cath - Continue Entresto  24/26 mg  BID - Continue spironolactone  12.5 mg daily - Continue Jardiance  10 mg daily   Plan: 1) Medication changes recommended at this time: - Agree with changes  2) Patient assistance: - Entresto  copay $0  3)  Education  - Patient has been educated on current HF medications and potential additions to HF medication regimen - Patient verbalizes understanding that over the next few months, these medication doses may change and more medications may be added to optimize HF regimen - Patient has been educated on basic disease state pathophysiology and goals of therapy   Duwaine Plant, PharmD, BCPS Heart Failure Stewardship Pharmacist Phone [phone]

## 2024-03-05 NOTE — Progress Notes (Signed)
 PROGRESS NOTE    HESHAM WOMAC  FMW:[id number] DOB: 28-Mar-1965 DOA: 03/02/2024 PCP: Paseda, Folashade R, FNP  Subjective: No acute events overnight. Informed by nursing of possible transient Afib read on tele while ambulating. Seen and examined at bedside. Reports feeling better. No new complaints. Tolerating oral intake without n/v. Denies constipation.  Hospital Course: 58 y.o. male with medical history significant of chronic tobacco smoking, THC use, hidradenitis.  He presents to the emergency room with complaints of progressive worsening shortness of breath over the past couple of weeks.  He had previously been seen in the emergency room and was initiated on treatment for suspected acute bronchitis.  Patient was initiated on treatment with steroids and azithromycin .  Despite this treatment, patient reports no improvement.  He denies any fever, chills but admits to worsening lower extremity edema.  He also reported orthopnea and paroxysmal nocturnal dyspnea. He admitted to worsening lower extremity edema and weight gain of about 30 pounds over the past several months.  No prior diagnosis of CHF.  Denies any sick contacts.   In the ED, patient was given round of IV Lasix .  He was also found to be slightly hypoxic hence oxygen supplementation with 2 L was offered.  CT angiogram done in the ED ruled out pulm emboli.  Cardiomegaly with trace right-sided pleural effusion noted.  Bronchial narrowing with suspected tracheal broncomalacia noted.   Assessment and Plan:  58 year old gentleman presented with acute shortness of breath.  Recently treated for acute bronchitis with minimal improvement.  Symptoms most likely consistent with CHF.  Patient also has some evidence of tracheal broncomalacia with inflammatory changes.   Acute heart failure Clinically fluid overloaded Patient says he was given diuretics once a couple of weeks ago for leg edema but it doesn't seem it he take it regularly TTE  showed no apical thrombus, LVEF 20-25%, severely reduced function, global hypokinesis, dilated LV with LVH, RVSP 31.2, normal PA systolic pressure, moderately dilated LA and RA, mild to moderate TR, mild AR, trivial MR, dilated IVC with <50% respiratory variability Venous US  bilateral legs negative for acute DVT Increase IV lasix  40mg  daily to BID Start entresto , spironolactone , jardiance   Will need beta blocker when fluid status better Monitor I/Os, daily weights Monitor on tele Monitor electrolytes and renal function Plan for possible cardiac cath to rule out CAD Cardiology following   Daytime sleepiness Admits to poor sleep overnight and frequent awakenings Patient noted to be somnolent during the day STOP-BANG score high Will touch base with respiratory or pulmonary to see if able to qualify for nocturnal O2 or CPAP while admitted Will need outpatient sleep study on discharge   Acute kidney injury Complex Renal cyst Cr 1.11 < 1.31, baseline 0.6-0.8 although last lab work is very old Etiology unclear Clinically fluid overloaded Diuresis as elsewhere Bladder scan 0  Renal US  showed medical renal disease, slightly complex cystic structure in upper pole of L kidney. Will need non-emergent MRI as outpatient  Monitor I/Os, weights Monitor BMP   Presumed COPD Tracheobronchomalacia As per patient, no prior PFTs or diagnosis of COPD/emphysema/chronic bronchitis Does admit to tobacco use Stopped steroids as this may worsen fluid retention No indication for antibiotics at this time. Cont albuterol  PRN   Polysubstance use disorder Patient admits to tobacco, THC and cocaine use Utox positive for cocaine and THC HIV screen negative Hep C Ab pending  Counseled on cessation  Continue Nicotine  patch Reports not interested in rehab    New onset diabetes  HgbA1c 6.5 Start jardiance  as elsewhere Cont on SSI with FS ACHS Seen by diabetes coordinator Will need metformin on discharge    History of hydradenitis suppurativa Monitor clinically  DVT prophylaxis: enoxaparin  (LOVENOX ) injection 40 mg Start: 03/03/24 1000 SCDs Start: 03/02/24 2106  Lovenox    Code Status: Full Code Family Communication: updated sister at bedside Disposition Plan: Home Reason for continuing need for hospitalization: IV diuresis, low LVEF workup likely cardiac cath, cardiology recommendations  Objective: Vitals:   03/04/24 1640 03/04/24 2213 03/05/24 0412 03/05/24 0814  BP: (!) 128/97 110/83 131/86 (!) 142/100  Pulse: (!) 121 64 (!) 101 68  Resp: 16 18 18 19   Temp: 97.7 F (36.5 C) 98.6 F (37 C) 97.7 F (36.5 C) 98.3 F (36.8 C)  TempSrc:  Oral Oral   SpO2: 95% 97% 96% 96%  Weight:      Height:        Intake/Output Summary (Last 24 hours) at 03/05/2024 1448 Last data filed at 03/05/2024 1443 Gross per 24 hour  Intake 800 ml  Output 2625 ml  Net -1825 ml   Filed Weights   03/02/24 1719  Weight: 113.4 kg    Examination:  Physical Exam Vitals and nursing note reviewed.  Constitutional:      General: He is not in acute distress.    Appearance: He is obese. He is ill-appearing.     Comments: Somnolent, disheveled  HENT:     Head: Normocephalic and atraumatic.  Cardiovascular:     Rate and Rhythm: Normal rate and regular rhythm.     Pulses: Normal pulses.     Heart sounds: Normal heart sounds.  Pulmonary:     Effort: Pulmonary effort is normal.     Breath sounds: Normal breath sounds.  Abdominal:     General: Bowel sounds are normal. There is no distension.     Palpations: Abdomen is soft.     Tenderness: There is no abdominal tenderness.  Musculoskeletal:     Right lower leg: Edema present.     Left lower leg: Edema present.  Neurological:     Mental Status: He is alert.     Data Reviewed: I have personally reviewed following labs and imaging studies  CBC: Recent Labs  Lab 03/02/24 1805 03/03/24 0455  WBC 10.5 11.7*  HGB 15.4 15.8  HCT 48.1 49.7   MCV 91.1 94.1  PLT 298 273   Basic Metabolic Panel: Recent Labs  Lab 03/02/24 1805 03/03/24 0455 03/04/24 0634 03/05/24 0403  NA 140 140 138 141  K 4.0 4.6 4.3 3.9  CL 104 101 105 104  CO2 26 30 23 25   GLUCOSE 148* 281* 172* 174*  BUN 17 15 23* 23*  CREATININE 1.02 1.31* 1.13 1.11  CALCIUM 8.7* 8.4* 8.7* 8.5*  MG  --   --   --  2.0   GFR: Estimated Creatinine Clearance: 99.5 mL/min (by C-G formula based on SCr of 1.11 mg/dL). Liver Function Tests: No results for input(s): AST, ALT, ALKPHOS, BILITOT, PROT, ALBUMIN in the last 168 hours. No results for input(s): LIPASE, AMYLASE in the last 168 hours. No results for input(s): AMMONIA in the last 168 hours. Coagulation Profile: No results for input(s): INR, PROTIME in the last 168 hours. Cardiac Enzymes: No results for input(s): CKTOTAL, CKMB, CKMBINDEX, TROPONINI in the last 168 hours. ProBNP, BNP (last 5 results) Recent Labs    03/02/24 1805  BNP 970.9*   HbA1C: Recent Labs    03/04/24 9365  HGBA1C 6.5*   CBG: Recent Labs  Lab 03/04/24 1618 03/04/24 2214 03/05/24 0812 03/05/24 1119  GLUCAP 142* 173* 209* 181*   Lipid Profile: No results for input(s): CHOL, HDL, LDLCALC, TRIG, CHOLHDL, LDLDIRECT in the last 72 hours. Thyroid Function Tests: Recent Labs    03/05/24 1202  TSH 4.769*   Anemia Panel: No results for input(s): VITAMINB12, FOLATE, FERRITIN, TIBC, IRON, RETICCTPCT in the last 72 hours. Sepsis Labs: No results for input(s): PROCALCITON, LATICACIDVEN in the last 168 hours.  Recent Results (from the past 240 hours)  Resp panel by RT-PCR (RSV, Flu A&B, Covid) Anterior Nasal Swab     Status: None   Collection Time: 03/02/24  5:56 PM   Specimen: Anterior Nasal Swab  Result Value Ref Range Status   SARS Coronavirus 2 by RT PCR NEGATIVE NEGATIVE Final   Influenza A by PCR NEGATIVE NEGATIVE Final   Influenza B by PCR NEGATIVE NEGATIVE Final     Comment: (NOTE) The Xpert Xpress SARS-CoV-2/FLU/RSV plus assay is intended as an aid in the diagnosis of influenza from Nasopharyngeal swab specimens and should not be used as a sole basis for treatment. Nasal washings and aspirates are unacceptable for Xpert Xpress SARS-CoV-2/FLU/RSV testing.  Fact Sheet for Patients: bloggercourse.com  Fact Sheet for Healthcare Providers: seriousbroker.it  This test is not yet approved or cleared by the [place]  FDA and has been authorized for detection and/or diagnosis of SARS-CoV-2 by FDA under an Emergency Use Authorization (EUA). This EUA will remain in effect (meaning this test can be used) for the duration of the COVID-19 declaration under Section 564(b)(1) of the Act, 21 U.S.C. section 360bbb-3(b)(1), unless the authorization is terminated or revoked.     Resp Syncytial Virus by PCR NEGATIVE NEGATIVE Final    Comment: (NOTE) Fact Sheet for Patients: bloggercourse.com  Fact Sheet for Healthcare Providers: seriousbroker.it  This test is not yet approved or cleared by the [place]  FDA and has been authorized for detection and/or diagnosis of SARS-CoV-2 by FDA under an Emergency Use Authorization (EUA). This EUA will remain in effect (meaning this test can be used) for the duration of the COVID-19 declaration under Section 564(b)(1) of the Act, 21 U.S.C. section 360bbb-3(b)(1), unless the authorization is terminated or revoked.  Performed at [facility], [address] [place], [place] [address]   Respiratory (~20 pathogens) panel by PCR     Status: None   Collection Time: 03/03/24  6:39 AM   Specimen: Nasopharyngeal Swab; Respiratory  Result Value Ref Range Status   Adenovirus NOT DETECTED NOT DETECTED Final   Coronavirus 229E NOT DETECTED NOT DETECTED Final    Comment: (NOTE) The Coronavirus on the Respiratory  Panel, DOES NOT test for the novel  Coronavirus (2019 nCoV)    Coronavirus HKU1 NOT DETECTED NOT DETECTED Final   Coronavirus NL63 NOT DETECTED NOT DETECTED Final   Coronavirus OC43 NOT DETECTED NOT DETECTED Final   Metapneumovirus NOT DETECTED NOT DETECTED Final   Rhinovirus / Enterovirus NOT DETECTED NOT DETECTED Final   Influenza A NOT DETECTED NOT DETECTED Final   Influenza B NOT DETECTED NOT DETECTED Final   Parainfluenza Virus 1 NOT DETECTED NOT DETECTED Final   Parainfluenza Virus 2 NOT DETECTED NOT DETECTED Final   Parainfluenza Virus 3 NOT DETECTED NOT DETECTED Final   Parainfluenza Virus 4 NOT DETECTED NOT DETECTED Final   Respiratory Syncytial Virus NOT DETECTED NOT DETECTED Final   Bordetella pertussis NOT DETECTED NOT DETECTED Final  Bordetella Parapertussis NOT DETECTED NOT DETECTED Final   Chlamydophila pneumoniae NOT DETECTED NOT DETECTED Final   Mycoplasma pneumoniae NOT DETECTED NOT DETECTED Final    Comment: Performed at [facility], [address] [place], [place] [address]     Radiology Studies: VAS US  LOWER EXTREMITY VENOUS (DVT) Result Date: 03/05/2024  Lower Venous DVT Study Patient Name:  ROBERTS BON  Date of Exam:   03/05/2024 Medical Rec #: [id number]          Accession #:    [id number] Date of Birth: Jul 08, 1965           Patient Gender: M Patient Age:   45 years Exam Location:  [facility] Procedure:      VAS US  LOWER EXTREMITY VENOUS (DVT) Referring Phys: NORVAL BAR --------------------------------------------------------------------------------  Indications: Edema.  Comparison Study: No previous exams Performing Technologist: Jody Hill RVT, RDMS  Examination Guidelines: A complete evaluation includes B-mode imaging, spectral Doppler, color Doppler, and power Doppler as needed of all accessible portions of each vessel. Bilateral testing is considered an integral part of a complete examination. Limited examinations for reoccurring indications  may be performed as noted. The reflux portion of the exam is performed with the patient in reverse Trendelenburg.  +---------+---------------+---------+-----------+----------+--------------+ RIGHT    CompressibilityPhasicitySpontaneityPropertiesThrombus Aging +---------+---------------+---------+-----------+----------+--------------+ CFV      Full           No       Yes                                 +---------+---------------+---------+-----------+----------+--------------+ SFJ      Full                                                        +---------+---------------+---------+-----------+----------+--------------+ FV Prox  Full           No       Yes                                 +---------+---------------+---------+-----------+----------+--------------+ FV Mid   Full           No       Yes                                 +---------+---------------+---------+-----------+----------+--------------+ FV DistalFull           No       Yes                                 +---------+---------------+---------+-----------+----------+--------------+ PFV      Full                                                        +---------+---------------+---------+-----------+----------+--------------+ POP      Full           No       Yes                                 +---------+---------------+---------+-----------+----------+--------------+  PTV      Full                                                        +---------+---------------+---------+-----------+----------+--------------+ PERO     Full                                                        +---------+---------------+---------+-----------+----------+--------------+ pulsatile dopple waveforms  +---------+---------------+---------+-----------+----------+--------------+ LEFT     CompressibilityPhasicitySpontaneityPropertiesThrombus Aging  +---------+---------------+---------+-----------+----------+--------------+ CFV      Full           No       Yes                                 +---------+---------------+---------+-----------+----------+--------------+ SFJ      Full                                                        +---------+---------------+---------+-----------+----------+--------------+ FV Prox  Full           No       Yes                                 +---------+---------------+---------+-----------+----------+--------------+ FV Mid   Full           No       Yes                                 +---------+---------------+---------+-----------+----------+--------------+ FV DistalFull           No       Yes                                 +---------+---------------+---------+-----------+----------+--------------+ PFV      Full                                                        +---------+---------------+---------+-----------+----------+--------------+ POP      Full           No       Yes                                 +---------+---------------+---------+-----------+----------+--------------+ PTV      Full                                                        +---------+---------------+---------+-----------+----------+--------------+  PERO     Full                                                        +---------+---------------+---------+-----------+----------+--------------+ pulsatile doppler waveforms   Summary: BILATERAL: - No evidence of deep vein thrombosis seen in the lower extremities, bilaterally. - RIGHT: - No cystic structure found in the popliteal fossa.  LEFT: - A cystic structure is found in the popliteal fossa 7.96 x 2.54 x 5.52 cm).  *See table(s) above for measurements and observations.    Preliminary    ECHOCARDIOGRAM COMPLETE Result Date: 03/04/2024    ECHOCARDIOGRAM REPORT   Patient Name:   BURDETT PINZON Date of Exam: 03/04/2024 Medical Rec #:   [id number]         Height:       74.0 in Accession #:    [id number]        Weight:       250.0 lb Date of Birth:  [birth year]          BSA:          2.390 m Patient Age:    58 years          BP:           137/99 mmHg Patient Gender: M                 HR:           118 bpm. Exam Location:  Inpatient Procedure: 2D Echo, Cardiac Doppler, Color Doppler and Intracardiac            Opacification Agent (Both Spectral and Color Flow Doppler were            utilized during procedure). Indications:    I50.40* Unspecified combined systolic (congestive) and diastolic                 (congestive) heart failure  History:        Patient has no prior history of Echocardiogram examinations.                 COPD, Signs/Symptoms:Edema, Shortness of Breath and Dyspnea;                 Risk Factors:Current Smoker.  Sonographer:    Ellouise Mose RDCS Referring Phys: [id number] MAUDE MARLA DART  Sonographer Comments: Technically difficult study due to poor echo windows. Patient moving throughout exam, could not stay awake. IMPRESSIONS  1. No apical thrombus with Definity  contrast. Left ventricular ejection fraction, by estimation, is 20 to 25%. The left ventricle has severely decreased function. The left ventricle demonstrates global hypokinesis. The left ventricular internal cavity size was moderately dilated. There is mild left ventricular hypertrophy. Left ventricular diastolic parameters are indeterminate.  2. Right ventricular systolic function is low normal. The right ventricular size is normal. There is normal pulmonary artery systolic pressure. The estimated right ventricular systolic pressure is 31.2 mmHg.  3. Left atrial size was moderately dilated.  4. Right atrial size was moderately dilated.  5. The mitral valve is grossly normal. Trivial mitral valve regurgitation.  6. Tricuspid valve regurgitation is mild to moderate.  7. The aortic valve is tricuspid. Aortic valve regurgitation is mild. Aortic valve sclerosis/calcification is  present, without any evidence of aortic stenosis.  8. The inferior vena  cava is dilated in size with <50% respiratory variability, suggesting right atrial pressure of 15 mmHg. Comparison(s): No prior Echocardiogram. FINDINGS  Left Ventricle: No apical thrombus with Definity  contrast. Left ventricular ejection fraction, by estimation, is 20 to 25%. The left ventricle has severely decreased function. The left ventricle demonstrates global hypokinesis. Definity  contrast agent was given IV to delineate the left ventricular endocardial borders. The left ventricular internal cavity size was moderately dilated. There is mild left ventricular hypertrophy. Left ventricular diastolic parameters are indeterminate. Right Ventricle: The right ventricular size is normal. No increase in right ventricular wall thickness. Right ventricular systolic function is low normal. There is normal pulmonary artery systolic pressure. The tricuspid regurgitant velocity is 2.01 m/s,  and with an assumed right atrial pressure of 15 mmHg, the estimated right ventricular systolic pressure is 31.2 mmHg. Left Atrium: Left atrial size was moderately dilated. Right Atrium: Right atrial size was moderately dilated. Pericardium: There is no evidence of pericardial effusion. Mitral Valve: The mitral valve is grossly normal. Trivial mitral valve regurgitation. Tricuspid Valve: The tricuspid valve is grossly normal. Tricuspid valve regurgitation is mild to moderate. Aortic Valve: The aortic valve is tricuspid. Aortic valve regurgitation is mild. Aortic valve sclerosis/calcification is present, without any evidence of aortic stenosis. Pulmonic Valve: The pulmonic valve was normal in structure. Pulmonic valve regurgitation is not visualized. Aorta: The aortic root and ascending aorta are structurally normal, with no evidence of dilitation. Venous: The inferior vena cava is dilated in size with less than 50% respiratory variability, suggesting right atrial  pressure of 15 mmHg. IAS/Shunts: No atrial level shunt detected by color flow Doppler.  LEFT VENTRICLE PLAX 2D LVIDd:         6.30 cm LVIDs:         5.30 cm LV PW:         1.20 cm LV IVS:        1.10 cm LVOT diam:     2.30 cm LV SV:         72 LV SV Index:   30 LVOT Area:     4.15 cm  LV Volumes (MOD) LV vol d, MOD A2C: 226.0 ml LV vol d, MOD A4C: 143.0 ml LV vol s, MOD A2C: 193.0 ml LV vol s, MOD A4C: 135.0 ml LV SV MOD A2C:     33.0 ml LV SV MOD A4C:     143.0 ml LV SV MOD BP:      14.6 ml RIGHT VENTRICLE             IVC RV S prime:     10.70 cm/s  IVC diam: 2.90 cm TAPSE (M-mode): 1.4 cm LEFT ATRIUM             Index        RIGHT ATRIUM           Index LA diam:        5.00 cm 2.09 cm/m   RA Area:     24.50 cm LA Vol (A2C):   85.5 ml 35.78 ml/m  RA Volume:   85.60 ml  35.82 ml/m LA Vol (A4C):   85.5 ml 35.78 ml/m LA Biplane Vol: 90.1 ml 37.70 ml/m  AORTIC VALVE LVOT Vmax:   110.90 cm/s LVOT Vmean:  78.767 cm/s LVOT VTI:    0.172 m  AORTA Ao Root diam: 3.40 cm Ao Asc diam:  3.40 cm MITRAL VALVE  TRICUSPID VALVE MV Area (PHT): 4.49 cm     TR Peak grad:   16.2 mmHg MV Decel Time: 169 msec     TR Vmax:        201.00 cm/s MV E velocity: 116.00 cm/s                             SHUNTS                             Systemic VTI:  0.17 m                             Systemic Diam: 2.30 cm Vinie Maxcy MD Electronically signed by Vinie Maxcy MD Signature Date/Time: 03/04/2024/4:16:09 PM    Final     Scheduled Meds:  empagliflozin   10 mg Oral Daily   enoxaparin  (LOVENOX ) injection  40 mg Subcutaneous Q24H   fluticasone   2 spray Each Nare Daily   furosemide   40 mg Intravenous BID   insulin  aspart  0-5 Units Subcutaneous QHS   insulin  aspart  0-9 Units Subcutaneous TID WC   nicotine   14 mg Transdermal Daily   sacubitril -valsartan   1 tablet Oral BID   sodium chloride  flush  3 mL Intravenous Q12H   spironolactone   12.5 mg Oral Daily   Continuous Infusions:   LOS: 3 days   Norval Bar,  MD  [facility]  03/05/2024, 2:48 PM

## 2024-03-05 NOTE — TOC Initial Note (Signed)
 Transition of Care [facility]) - Initial/Assessment Note    Patient Details  Name: James Wright MRN: [id number] Date of Birth: Nov 04, 1965  Transition of Care [facility]) CM/SW Contact:    Lendia Dais, LCSWA Phone Number: 03/05/2024, 1:22 PM  Clinical Narrative:  Pt is from home w/ domestic partner. Pt is indep w/ ADL's, reports no use of DME, and drives outside the hospital.   Pt stated that they would like their sister [name] to be their primary contact. CSW updated pt contact. Pt states they have seen their PCP Dr. Juanice in the last year, and does not have a HCPOA and requested info. Spiritual consult placed.   Pt reports no needs or MH hx. Pt reports recreational use of marijuana but stated they are not dependent on it.   No current TOC needs. Please place consult if new needs arise.                 Expected Discharge Plan: Home/Self Care Barriers to Discharge: Continued Medical Work up   Patient Goals and CMS Choice Patient states their goals for this hospitalization and ongoing recovery are:: Stay alive   Choice offered to / list presented to : NA      Expected Discharge Plan and Services In-house Referral: Clinical Social Work, Chaplain     Living arrangements for the past 2 months: Single Family Home                                      Prior Living Arrangements/Services Living arrangements for the past 2 months: Single Family Home Lives with:: Domestic Partner Patient language and need for interpreter reviewed:: Yes        Need for Family Participation in Patient Care: No (Comment) Care giver support system in place?: No (comment)   Criminal Activity/Legal Involvement Pertinent to Current Situation/Hospitalization: No - Comment as needed  Activities of Daily Living   ADL Screening (condition at time of admission) Independently performs ADLs?: Yes (appropriate for developmental age) Is the patient deaf or have difficulty hearing?: No Does the  patient have difficulty seeing, even when wearing glasses/contacts?: No Does the patient have difficulty concentrating, remembering, or making decisions?: No  Permission Sought/Granted      Share Information with NAME: [name],[name]     Permission granted to share info w Relationship: Sister, Emergency Contact  Permission granted to share info w Contact Information: [phone]  Emotional Assessment Appearance:: Appears stated age Attitude/Demeanor/Rapport: Engaged Affect (typically observed): Appropriate, Pleasant Orientation: : Oriented to Situation, Oriented to Self, Oriented to Place, Oriented to  Time Alcohol / Substance Use: Illicit Drugs Psych Involvement: No (comment)  Admission diagnosis:  Hypoxia [R09.02] COPD with acute exacerbation (HCC) [J44.1] Chronic obstructive pulmonary disease with acute exacerbation (HCC) [J44.1] Allergic rhinitis, unspecified seasonality, unspecified trigger [J30.9] Acute on chronic congestive heart failure, unspecified heart failure type [facility]) [I50.9] Patient Active Problem List   Diagnosis Date Noted   COPD with acute exacerbation (HCC) 03/02/2024   Acute bronchitis 01/10/2024   Wheezing 01/10/2024   Prediabetes 01/10/2024   Bilateral lower extremity edema 01/10/2024   Marijuana smoker 12/24/2022   Allergic rhinitis 12/24/2022   Tinea capitis 12/24/2022   Abscess of groin, right 02/16/2017   Tobacco use disorder, moderate, dependence 02/16/2017   DENTAL PAIN 01/18/2008   DEPRESSION 09/21/2007   INSOMNIA 09/21/2007   LUMBAR RADICULOPATHY 12/20/2005   HERNIATED LUMBAR DISC 02/25/2005  PCP:  Paseda, Folashade R, FNP Pharmacy:   [facility] [phone] [address], [place] - [address] AT [facility] [address] [place] [address] Phone: [phone] Fax: [phone]  [facility] [address] [place] [address] Phone: [phone] Fax:  [phone]     Social Drivers of Health (SDOH) Social History: SDOH Screenings   Food Insecurity: No Food Insecurity (03/03/2024)  Housing: Low Risk (03/03/2024)  Transportation Needs: No Transportation Needs (03/03/2024)  Utilities: Not At Risk (03/03/2024)  Depression (PHQ2-9): Low Risk (01/10/2024)  Tobacco Use: High Risk (03/05/2024)   SDOH Interventions:     Readmission Risk Interventions     No data to display

## 2024-03-05 NOTE — Inpatient Diabetes Management (Signed)
 Inpatient Diabetes Program Recommendations  AACE/ADA: New Consensus Statement on Inpatient Glycemic Control (2015)  Target Ranges:  Prepandial:   less than 140 mg/dL      Peak postprandial:   less than 180 mg/dL (1-2 hours)      Critically ill patients:  140 - 180 mg/dL   Lab Results  Component Value Date   GLUCAP 181 (H) 03/05/2024   HGBA1C 6.5 (H) 03/04/2024    Review of Glycemic Control  Diabetes history: New Onset DM2 Outpatient Diabetes medications: None Current orders for Inpatient glycemic control: Jardiance  10 mg daily Novolog  0-9 units tid, 0-5 units hs  Inpatient Diabetes Program Recommendations:   Spoke with pt and sister about new diagnosis. Discussed A1C results with them and explained what an A1C is, basic pathophysiology of DM Type 2, basic home care, basic diabetes diet nutrition principles, importance of checking CBGs and maintaining good CBG control to prevent long-term and short-term complications. Reviewed signs and symptoms of hyperglycemia and hypoglycemia and how to treat hypoglycemia at home. Also reviewed blood sugar goals at home.  RNs to provide ongoing basic DM education at bedside with this patient. Have ordered educational booklet Living Well With diabetes.  Will need glucose meter and supplies @ discharge [phone]  Thank you, Miliyah Luper E. Paulett Kaufhold, RN, MSN, CNS, CDCES  Diabetes Coordinator Inpatient Glycemic Control Team Team Pager [phone] (8am-5pm) 03/05/2024 1:11 PM

## 2024-03-06 LAB — BASIC METABOLIC PANEL WITH GFR
Anion gap: 8 (ref 5–15)
BUN: 24 mg/dL — ABNORMAL HIGH (ref 6–20)
CO2: 30 mmol/L (ref 22–32)
Calcium: 8.5 mg/dL — ABNORMAL LOW (ref 8.9–10.3)
Chloride: 104 mmol/L (ref 98–111)
Creatinine, Ser: 1.28 mg/dL — ABNORMAL HIGH (ref 0.61–1.24)
GFR, Estimated: >60 mL/min (ref >60)
Glucose, Bld: 145 mg/dL — ABNORMAL HIGH (ref 70–99)
Potassium: 4.2 mmol/L (ref 3.5–5.1)
Sodium: 142 mmol/L (ref 135–145)

## 2024-03-06 LAB — T4, FREE: Free T4: 0.89 ng/dL (ref 0.80–2.00)

## 2024-03-06 LAB — GLUCOSE, CAPILLARY: Glucose-Capillary: 202 mg/dL — ABNORMAL HIGH (ref 70–99)

## 2024-03-06 LAB — HCV INTERPRETATION

## 2024-03-06 LAB — HCV AB W REFLEX TO QUANT PCR: HCV Ab: NONREACTIVE

## 2024-03-06 MED ORDER — FUROSEMIDE 10 MG/ML IJ SOLN: 40.0000 mg | Freq: Every day | INTRAMUSCULAR | Status: DC

## 2024-03-06 MED ORDER — ASPIRIN 81 MG PO CHEW
81.0000 mg | CHEWABLE_TABLET | Freq: Every day | ORAL | Status: DC
  Administered 2024-03-06: 10:00:00 81 mg via ORAL
  Filled 2024-03-06: qty 1

## 2024-03-06 NOTE — Progress Notes (Addendum)
 Pt has left unit AMA. Prior to leaving, pt was encouraged to wait to speak with his provider. He removed his IV and left unit. He refused to sign AMA paperwork. Charge nurse, CM and hospitalist made aware.   Pt's sister made aware via telephone that pt has left hospital AMA. She stated that, No ones doing anything for him there. No further concerns with pt or family at this time.

## 2024-03-06 NOTE — Progress Notes (Signed)
 Rounding Note   Patient Name: James Wright Date of Encounter: 03/06/2024  [facility] Cardiologist: New  Subjective No CP or dyspnea  Scheduled Meds:  empagliflozin   10 mg Oral Daily   enoxaparin  (LOVENOX ) injection  40 mg Subcutaneous Q24H   fluticasone   2 spray Each Nare Daily   furosemide   40 mg Intravenous BID   insulin  aspart  0-5 Units Subcutaneous QHS   insulin  aspart  0-9 Units Subcutaneous TID WC   nicotine   14 mg Transdermal Daily   sacubitril -valsartan   1 tablet Oral BID   sodium chloride  flush  3 mL Intravenous Q12H   spironolactone   12.5 mg Oral Daily   Continuous Infusions:  PRN Meds: acetaminophen  **OR** acetaminophen , albuterol , ondansetron  **OR** ondansetron  (ZOFRAN ) IV, senna-docusate, sodium chloride  flush   Vital Signs  Vitals:   03/05/24 1747 03/05/24 2007 03/06/24 0421 03/06/24 0751  BP: 119/84 108/82 (!) 109/93 (!) 95/55  Pulse: (!) 111 (!) 106 (!) 105 85  Resp: 18 18 18 18   Temp: 98.3 F (36.8 C) (!) 97.5 F (36.4 C) 98.6 F (37 C) 98.6 F (37 C)  TempSrc:      SpO2: 100% 96% 97% 91%  Weight:      Height:        Intake/Output Summary (Last 24 hours) at 03/06/2024 0810 Last data filed at 03/06/2024 0423 Gross per 24 hour  Intake 1340 ml  Output 5075 ml  Net -3735 ml      03/02/2024    5:19 PM 01/10/2024    2:13 PM 12/24/2022    8:02 AM  Last 3 Weights  Weight (lbs) 250 lb 245 lb 216 lb  Weight (kg) 113.399 kg 111.131 kg 97.977 kg      Telemetry Sinus - Personally Reviewed   Physical Exam  GEN: No acute distress.   Neck: supple Cardiac: RRR Respiratory: Clear to auscultation bilaterally. GI: Soft, nontender, non-distended  MS: 1+ edema. Neuro:  Nonfocal  Psych: Normal affect   Labs High Sensitivity Troponin:   Recent Labs  Lab 03/02/24 1805 03/02/24 2005  TROPONINIHS 60* 68*   No results for input(s): TRNPT in the last 720 hours.     Chemistry Recent Labs  Lab 03/04/24 0634 03/05/24 0403  03/06/24 0513  NA 138 141 142  K 4.3 3.9 4.2  CL 105 104 104  CO2 23 25 30   GLUCOSE 172* 174* 145*  BUN 23* 23* 24*  CREATININE 1.13 1.11 1.28*  CALCIUM 8.7* 8.5* 8.5*  MG  --  2.0  --   GFRNONAA >60 >60 >60  ANIONGAP 10 12 8     Hematology Recent Labs  Lab 03/02/24 1805 03/03/24 0455  WBC 10.5 11.7*  RBC 5.28 5.28  HGB 15.4 15.8  HCT 48.1 49.7  MCV 91.1 94.1  MCH 29.2 29.9  MCHC 32.0 31.8  RDW 15.0 15.2  PLT 298 273   Thyroid  Recent Labs  Lab 03/05/24 1202 03/06/24 0513  TSH 4.769*  --   FREET4  --  0.89    BNP Recent Labs  Lab 03/02/24 1805  BNP 970.9*    Radiology  VAS US  LOWER EXTREMITY VENOUS (DVT) Result Date: 03/05/2024  Lower Venous DVT Study Patient Name:  James Wright  Date of Exam:   03/05/2024 Medical Rec #: [id number]          Accession #:    [id number] Date of Birth: [birth year]           Patient Gender: James Wright  Patient Age:   58 years Exam Location:  [facility] Procedure:      VAS US  LOWER EXTREMITY VENOUS (DVT) Referring Phys: NORVAL BAR --------------------------------------------------------------------------------  Indications: Edema.  Comparison Study: No previous exams Performing Technologist: Jody Hill RVT, RDMS  Examination Guidelines: A complete evaluation includes B-mode imaging, spectral Doppler, color Doppler, and power Doppler as needed of all accessible portions of each vessel. Bilateral testing is considered an integral part of a complete examination. Limited examinations for reoccurring indications may be performed as noted. The reflux portion of the exam is performed with the patient in reverse Trendelenburg.  +---------+---------------+---------+-----------+----------+--------------+ RIGHT    CompressibilityPhasicitySpontaneityPropertiesThrombus Aging +---------+---------------+---------+-----------+----------+--------------+ CFV      Full           No       Yes                                  +---------+---------------+---------+-----------+----------+--------------+ SFJ      Full                                                        +---------+---------------+---------+-----------+----------+--------------+ FV Prox  Full           No       Yes                                 +---------+---------------+---------+-----------+----------+--------------+ FV Mid   Full           No       Yes                                 +---------+---------------+---------+-----------+----------+--------------+ FV DistalFull           No       Yes                                 +---------+---------------+---------+-----------+----------+--------------+ PFV      Full                                                        +---------+---------------+---------+-----------+----------+--------------+ POP      Full           No       Yes                                 +---------+---------------+---------+-----------+----------+--------------+ PTV      Full                                                        +---------+---------------+---------+-----------+----------+--------------+ PERO     Full                                                        +---------+---------------+---------+-----------+----------+--------------+  pulsatile dopple waveforms  +---------+---------------+---------+-----------+----------+--------------+ LEFT     CompressibilityPhasicitySpontaneityPropertiesThrombus Aging +---------+---------------+---------+-----------+----------+--------------+ CFV      Full           No       Yes                                 +---------+---------------+---------+-----------+----------+--------------+ SFJ      Full                                                        +---------+---------------+---------+-----------+----------+--------------+ FV Prox  Full           No       Yes                                  +---------+---------------+---------+-----------+----------+--------------+ FV Mid   Full           No       Yes                                 +---------+---------------+---------+-----------+----------+--------------+ FV DistalFull           No       Yes                                 +---------+---------------+---------+-----------+----------+--------------+ PFV      Full                                                        +---------+---------------+---------+-----------+----------+--------------+ POP      Full           No       Yes                                 +---------+---------------+---------+-----------+----------+--------------+ PTV      Full                                                        +---------+---------------+---------+-----------+----------+--------------+ PERO     Full                                                        +---------+---------------+---------+-----------+----------+--------------+ pulsatile doppler waveforms    Summary: BILATERAL: - No evidence of deep vein thrombosis seen in the lower extremities, bilaterally. - RIGHT: - No cystic structure found in the popliteal fossa.  LEFT: - A cystic structure is found in the popliteal fossa 7.96 x 2.54 x 5.52 cm).  *See table(s) above for  measurements and observations. Electronically signed by Lonni Gaskins MD on 03/05/2024 at 4:10:57 PM.    Final    ECHOCARDIOGRAM COMPLETE Result Date: 03/04/2024    ECHOCARDIOGRAM REPORT   Patient Name:   James Wright Date of Exam: 03/04/2024 Medical Rec #:  [id number]         Height:       74.0 in Accession #:    [id number]        Weight:       250.0 lb Date of Birth:  Aug 25, 1965          BSA:          2.390 m Patient Age:    58 years          BP:           137/99 mmHg Patient Gender: M                 HR:           118 bpm. Exam Location:  Inpatient Procedure: 2D Echo, Cardiac Doppler, Color Doppler and Intracardiac             Opacification Agent (Both Spectral and Color Flow Doppler were            utilized during procedure). Indications:    I50.40* Unspecified combined systolic (congestive) and diastolic                 (congestive) heart failure  History:        Patient has no prior history of Echocardiogram examinations.                 COPD, Signs/Symptoms:Edema, Shortness of Breath and Dyspnea;                 Risk Factors:Current Smoker.  Sonographer:    Ellouise Mose RDCS Referring Phys: [id number] James Wright  Sonographer Comments: Technically difficult study due to poor echo windows. Patient moving throughout exam, could not stay awake. IMPRESSIONS  1. No apical thrombus with Definity  contrast. Left ventricular ejection fraction, by estimation, is 20 to 25%. The left ventricle has severely decreased function. The left ventricle demonstrates global hypokinesis. The left ventricular internal cavity size was moderately dilated. There is mild left ventricular hypertrophy. Left ventricular diastolic parameters are indeterminate.  2. Right ventricular systolic function is low normal. The right ventricular size is normal. There is normal pulmonary artery systolic pressure. The estimated right ventricular systolic pressure is 31.2 mmHg.  3. Left atrial size was moderately dilated.  4. Right atrial size was moderately dilated.  5. The mitral valve is grossly normal. Trivial mitral valve regurgitation.  6. Tricuspid valve regurgitation is mild to moderate.  7. The aortic valve is tricuspid. Aortic valve regurgitation is mild. Aortic valve sclerosis/calcification is present, without any evidence of aortic stenosis.  8. The inferior vena cava is dilated in size with <50% respiratory variability, suggesting right atrial pressure of 15 mmHg. Comparison(s): No prior Echocardiogram. FINDINGS  Left Ventricle: No apical thrombus with Definity  contrast. Left ventricular ejection fraction, by estimation, is 20 to 25%. The left ventricle has  severely decreased function. The left ventricle demonstrates global hypokinesis. Definity  contrast agent was given IV to delineate the left ventricular endocardial borders. The left ventricular internal cavity size was moderately dilated. There is mild left ventricular hypertrophy. Left ventricular diastolic parameters are indeterminate. Right Ventricle: The right ventricular size is normal. No increase in right ventricular wall thickness. Right ventricular systolic  function is low normal. There is normal pulmonary artery systolic pressure. The tricuspid regurgitant velocity is 2.01 m/s,  and with an assumed right atrial pressure of 15 mmHg, the estimated right ventricular systolic pressure is 31.2 mmHg. Left Atrium: Left atrial size was moderately dilated. Right Atrium: Right atrial size was moderately dilated. Pericardium: There is no evidence of pericardial effusion. Mitral Valve: The mitral valve is grossly normal. Trivial mitral valve regurgitation. Tricuspid Valve: The tricuspid valve is grossly normal. Tricuspid valve regurgitation is mild to moderate. Aortic Valve: The aortic valve is tricuspid. Aortic valve regurgitation is mild. Aortic valve sclerosis/calcification is present, without any evidence of aortic stenosis. Pulmonic Valve: The pulmonic valve was normal in structure. Pulmonic valve regurgitation is not visualized. Aorta: The aortic root and ascending aorta are structurally normal, with no evidence of dilitation. Venous: The inferior vena cava is dilated in size with less than 50% respiratory variability, suggesting right atrial pressure of 15 mmHg. IAS/Shunts: No atrial level shunt detected by color flow Doppler.  LEFT VENTRICLE PLAX 2D LVIDd:         6.30 cm LVIDs:         5.30 cm LV PW:         1.20 cm LV IVS:        1.10 cm LVOT diam:     2.30 cm LV SV:         72 LV SV Index:   30 LVOT Area:     4.15 cm  LV Volumes (MOD) LV vol d, MOD A2C: 226.0 ml LV vol d, MOD A4C: 143.0 ml LV vol s, MOD  A2C: 193.0 ml LV vol s, MOD A4C: 135.0 ml LV SV MOD A2C:     33.0 ml LV SV MOD A4C:     143.0 ml LV SV MOD BP:      14.6 ml RIGHT VENTRICLE             IVC RV S prime:     10.70 cm/s  IVC diam: 2.90 cm TAPSE (M-mode): 1.4 cm LEFT ATRIUM             Index        RIGHT ATRIUM           Index LA diam:        5.00 cm 2.09 cm/m   RA Area:     24.50 cm LA Vol (A2C):   85.5 ml 35.78 ml/m  RA Volume:   85.60 ml  35.82 ml/m LA Vol (A4C):   85.5 ml 35.78 ml/m LA Biplane Vol: 90.1 ml 37.70 ml/m  AORTIC VALVE LVOT Vmax:   110.90 cm/s LVOT Vmean:  78.767 cm/s LVOT VTI:    0.172 m  AORTA Ao Root diam: 3.40 cm Ao Asc diam:  3.40 cm MITRAL VALVE                TRICUSPID VALVE MV Area (PHT): 4.49 cm     TR Peak grad:   16.2 mmHg MV Decel Time: 169 msec     TR Vmax:        201.00 cm/s MV E velocity: 116.00 cm/s                             SHUNTS                             Systemic VTI:  0.17 m                             Systemic Diam: 2.30 cm Vinie Maxcy MD Electronically signed by Vinie Maxcy MD Signature Date/Time: 03/04/2024/4:16:09 PM    Final      Patient Profile   James Wright is a 58 y.o. male with a hx of left eye blindness and substance abuse who is being seen for the evaluation of acute systolic congestive heart failure.  Echocardiogram shows ejection fraction 20 to 25%, moderate left ventricular enlargement, mild left ventricular hypertrophy, moderate biatrial enlargement, mild to moderate tricuspid regurgitation.  Assessment & Plan  1 acute systolic congestive heart failure-vigorous diuresis.  I/0 -10595 since admission.  Edema improving on examination and blood pressure borderline.  Decrease Lasix  to 40 mg daily, continue spironolactone  12.5 mg daily and Jardiance  10 mg daily.  Continue to follow renal function with slight increase creatinine today.  2 cardiomyopathy-etiology unclear.  As outlined previously his hemoglobin A1c is elevated and he has a history of tobacco use.  Also with family  history of coronary disease.  Will plan right and left cardiac catheterization tomorrow if his renal function is stable.  The risk and benefits including myocardial infarction, CVA and death discussed and he agrees to proceed.  Will continue Entresto  but monitor blood pressure closely as it is borderline this morning.  Can add carvedilol later as CHF improves and as blood pressure allows.  3 substance abuse-we discussed the importance of avoiding.  4 renal lesion-noted on renal ultrasound.  Will need elective follow-up MRI.  5 mediastinal/hilar adenopathy-follow-up CT with primary care as an outpatient.  For questions or updates, please contact [facility] Please consult [email or web address] for contact info under     Signed, Redell Shallow, MD  03/06/2024, 8:10 AM

## 2024-03-06 NOTE — Progress Notes (Signed)
 Heart Failure Nurse Navigator Progress Note    Patient admitted with EF 20-25%, BNP 970, with a 40 pound weight gain. No HF TOC , patient left AMA on 03/06/2024.   Navigator will sign off at this time.   Stephane Haddock, BSN, Scientist, Clinical (histocompatibility And Immunogenetics) Only

## 2024-03-06 NOTE — Discharge Summary (Signed)
 [facility] Physician Discharge Summary   Patient name: James Wright  Admit date:     03/02/2024  Discharge date: 03/06/2024  Attending Physician: MARCA MAUDE POUR [[id number]]  Discharge Physician: Norval Bar   PCP: Paseda, Folashade R, FNP  Admitted From: Home  Disposition:  Unknown, Left AMA  Recommendations for Outpatient Follow-up:  Left AMA  Home Health:No Equipment/Devices: @ECDMELIST @  Discharge Condition:Guarded CODE STATUS:FULL Diet recommendation: Heart Healthy/Diabetic Fluid Restriction: None  Hospital Summary: 58 y.o. male with medical history significant of chronic tobacco smoking, THC use, hidradenitis.  He presents to the emergency room with complaints of progressive worsening shortness of breath over the past couple of weeks.  He had previously been seen in the emergency room and was initiated on treatment for suspected acute bronchitis.  Patient was initiated on treatment with steroids and azithromycin .  Despite this treatment, patient reports no improvement.  He denies any fever, chills but admits to worsening lower extremity edema.  He also reported orthopnea and paroxysmal nocturnal dyspnea. He admitted to worsening lower extremity edema and weight gain of about 30 pounds over the past several months.  No prior diagnosis of CHF.  Denies any sick contacts.   In the ED, patient was given round of IV Lasix .  He was also found to be slightly hypoxic hence oxygen supplementation with 2 L was offered.  CT angiogram done in the ED ruled out pulm emboli.  Cardiomegaly with trace right-sided pleural effusion noted.  Bronchial narrowing with suspected tracheal broncomalacia noted.  Hospital Course by Problem:  58 year old gentleman presented with acute shortness of breath.  Recently treated for acute bronchitis with minimal improvement.  Symptoms most likely consistent with CHF.  Patient also has some evidence of tracheal broncomalacia with inflammatory  changes.   Acute heart failure Clinically fluid overloaded Patient says he was given diuretics once a couple of weeks ago for leg edema but it doesn't seem it he take it regularly TTE showed no apical thrombus, LVEF 20-25%, severely reduced function, global hypokinesis, dilated LV with LVH, RVSP 31.2, normal PA systolic pressure, moderately dilated LA and RA, mild to moderate TR, mild AR, trivial MR, dilated IVC with <50% respiratory variability. Venous US  bilateral legs negative for acute DVT - patient left AMA unfortunately without any scripts for medications and referrals - risks of leaving against medical advice were discussed including the possibility of myocardial infarction, cardiogenic shock, death. Patient understood all the risks. However, later this morning decided to leave anyway.   Daytime sleepiness Admits to poor sleep overnight and frequent awakenings Patient noted to be somnolent during the day STOP-BANG score high - left AMA without a referral for outpatient sleep study    Acute kidney injury Complex Renal cyst Cr 1.11 < 1.31, baseline 0.6-0.8 although last lab work is very old. Likely cardiorenal in nature. Renal US  showed medical renal disease, slightly complex cystic structure in upper pole of L kidney. Will need non-emergent MRI as outpatient  - was responding to IV fluids, however, decided to leave against medical advice   Polysubstance use disorder Patient admits to tobacco, THC and cocaine use. Utox positive for cocaine and THC. HIV screen negative. Hep C Ab pending. Counseled on cessation. Not interested in rehab.  - Left AMA   New onset diabetes HgbA1c 6.5. New diagnosis. Was started on jardiance  inpatient - left AMA without medications and referrals   History of hydradenitis suppurativa Monitor clinically  Discharge Diagnoses:  Principal Problem:   COPD with acute  exacerbation [facility])   Discharge Instructions    Allergies[1]  Discharge  Exam: Vitals:   03/06/24 0751 03/06/24 0818  BP: (!) 95/55 112/69  Pulse: 85 (!) 105  Resp: 18   Temp: 98.6 F (37 C)   SpO2: 91%     Physical Exam Vitals and nursing note reviewed.  Constitutional:      General: He is not in acute distress.    Appearance: He is obese. He is ill-appearing.  HENT:     Head: Normocephalic and atraumatic.  Cardiovascular:     Rate and Rhythm: Normal rate and regular rhythm.     Pulses: Normal pulses.     Heart sounds: Normal heart sounds.  Pulmonary:     Effort: Pulmonary effort is normal.     Breath sounds: Normal breath sounds.  Abdominal:     General: Bowel sounds are normal.     Palpations: Abdomen is soft.  Musculoskeletal:     Right lower leg: Edema present.     Left lower leg: Edema present.  Neurological:     Mental Status: He is alert.     The results of significant diagnostics from this hospitalization (including imaging, microbiology, ancillary and laboratory) are listed below for reference.    Microbiology: Recent Results (from the past 240 hours)  Resp panel by RT-PCR (RSV, Flu A&B, Covid) Anterior Nasal Swab     Status: None   Collection Time: 03/02/24  5:56 PM   Specimen: Anterior Nasal Swab  Result Value Ref Range Status   SARS Coronavirus 2 by RT PCR NEGATIVE NEGATIVE Final   Influenza A by PCR NEGATIVE NEGATIVE Final   Influenza B by PCR NEGATIVE NEGATIVE Final    Comment: (NOTE) The Xpert Xpress SARS-CoV-2/FLU/RSV plus assay is intended as an aid in the diagnosis of influenza from Nasopharyngeal swab specimens and should not be used as a sole basis for treatment. Nasal washings and aspirates are unacceptable for Xpert Xpress SARS-CoV-2/FLU/RSV testing.  Fact Sheet for Patients: bloggercourse.com  Fact Sheet for Healthcare Providers: seriousbroker.it  This test is not yet approved or cleared by the [place]  FDA and has been authorized for detection and/or  diagnosis of SARS-CoV-2 by FDA under an Emergency Use Authorization (EUA). This EUA will remain in effect (meaning this test can be used) for the duration of the COVID-19 declaration under Section 564(b)(1) of the Act, 21 U.S.C. section 360bbb-3(b)(1), unless the authorization is terminated or revoked.     Resp Syncytial Virus by PCR NEGATIVE NEGATIVE Final    Comment: (NOTE) Fact Sheet for Patients: bloggercourse.com  Fact Sheet for Healthcare Providers: seriousbroker.it  This test is not yet approved or cleared by the [place]  FDA and has been authorized for detection and/or diagnosis of SARS-CoV-2 by FDA under an Emergency Use Authorization (EUA). This EUA will remain in effect (meaning this test can be used) for the duration of the COVID-19 declaration under Section 564(b)(1) of the Act, 21 U.S.C. section 360bbb-3(b)(1), unless the authorization is terminated or revoked.  Performed at [facility], [address], [place], [place] [address]   Respiratory (~20 pathogens) panel by PCR     Status: None   Collection Time: 03/03/24  6:39 AM   Specimen: Nasopharyngeal Swab; Respiratory  Result Value Ref Range Status   Adenovirus NOT DETECTED NOT DETECTED Final   Coronavirus 229E NOT DETECTED NOT DETECTED Final    Comment: (NOTE) The Coronavirus on the Respiratory Panel, DOES NOT test for the novel  Coronavirus (2019 nCoV)    Coronavirus HKU1 NOT DETECTED NOT DETECTED Final   Coronavirus NL63 NOT DETECTED NOT DETECTED Final   Coronavirus OC43 NOT DETECTED NOT DETECTED Final   Metapneumovirus NOT DETECTED NOT DETECTED Final   Rhinovirus / Enterovirus NOT DETECTED NOT DETECTED Final   Influenza A NOT DETECTED NOT DETECTED Final   Influenza B NOT DETECTED NOT DETECTED Final   Parainfluenza Virus 1 NOT DETECTED NOT DETECTED Final   Parainfluenza Virus 2 NOT DETECTED NOT DETECTED Final   Parainfluenza Virus 3 NOT  DETECTED NOT DETECTED Final   Parainfluenza Virus 4 NOT DETECTED NOT DETECTED Final   Respiratory Syncytial Virus NOT DETECTED NOT DETECTED Final   Bordetella pertussis NOT DETECTED NOT DETECTED Final   Bordetella Parapertussis NOT DETECTED NOT DETECTED Final   Chlamydophila pneumoniae NOT DETECTED NOT DETECTED Final   Mycoplasma pneumoniae NOT DETECTED NOT DETECTED Final    Comment: Performed at [facility], [address] [place], [place] [address]     Labs: ProBNP, BNP (last 5 results) Recent Labs    03/02/24 1805  BNP 970.9*   Basic Metabolic Panel: Recent Labs  Lab 03/02/24 1805 03/03/24 0455 03/04/24 0634 03/05/24 0403 03/06/24 0513  NA 140 140 138 141 142  K 4.0 4.6 4.3 3.9 4.2  CL 104 101 105 104 104  CO2 26 30 23 25 30   GLUCOSE 148* 281* 172* 174* 145*  BUN 17 15 23* 23* 24*  CREATININE 1.02 1.31* 1.13 1.11 1.28*  CALCIUM 8.7* 8.4* 8.7* 8.5* 8.5*  MG  --   --   --  2.0  --    Liver Function Tests: No results for input(s): AST, ALT, ALKPHOS, BILITOT, PROT, ALBUMIN in the last 168 hours. No results for input(s): LIPASE, AMYLASE in the last 168 hours. No results for input(s): AMMONIA in the last 168 hours. CBC: Recent Labs  Lab 03/02/24 1805 03/03/24 0455  WBC 10.5 11.7*  HGB 15.4 15.8  HCT 48.1 49.7  MCV 91.1 94.1  PLT 298 273   Cardiac Enzymes: Recent Labs  Lab 03/02/24 1805 03/02/24 2005  TROPONINIHS 60* 68*   BNP: Recent Labs  Lab 03/02/24 1805  BNP 970.9*   CBG: Recent Labs  Lab 03/05/24 0812 03/05/24 1119 03/05/24 1748 03/05/24 2008 03/06/24 0752  GLUCAP 209* 181* 146* 168* 202*   D-Dimer No results for input(s): DDIMER in the last 72 hours. Hgb A1c Recent Labs    03/04/24 0634  HGBA1C 6.5*   Lipid Profile No results for input(s): CHOL, HDL, LDLCALC, TRIG, CHOLHDL, LDLDIRECT in the last 72 hours. Thyroid function studies Recent Labs    03/05/24 1202 03/06/24 0513  TSH 4.769*  --    FREET4  --  0.89   Anemia work up No results for input(s): VITAMINB12, FOLATE, FERRITIN, TIBC, IRON, RETICCTPCT in the last 72 hours. Urinalysis    Component Value Date/Time   COLORURINE YELLOW 03/03/2024 0636   APPEARANCEUR CLEAR 03/03/2024 0636   LABSPEC 1.024 03/03/2024 0636   PHURINE 7.0 03/03/2024 0636   GLUCOSEU >=500 (A) 03/03/2024 0636   HGBUR NEGATIVE 03/03/2024 0636   BILIRUBINUR NEGATIVE 03/03/2024 0636   KETONESUR NEGATIVE 03/03/2024 0636   PROTEINUR NEGATIVE 03/03/2024 0636   NITRITE NEGATIVE 03/03/2024 0636   LEUKOCYTESUR NEGATIVE 03/03/2024 0636   Sepsis Labs Recent Labs  Lab 03/02/24 1805 03/03/24 0455  WBC 10.5 11.7*    Procedures/Studies: VAS US  LOWER EXTREMITY VENOUS (DVT) Result Date: 03/05/2024  Lower Venous DVT Study  Patient Name:  James Wright  Date of Exam:   03/05/2024 Medical Rec #: [id number]          Accession #:    [id number] Date of Birth: 11/11/1965           Patient Gender: M Patient Age:   89 years Exam Location[facility]  [facility] Procedure:      VAS US  LOWER EXTREMITY VENOUS (DVT) Referring Phys: NORVAL BAR --------------------------------------------------------------------------------  Indications: Edema.  Comparison Study: No previous exams Performing Technologist: Jody Hill RVT, RDMS  Examination Guidelines: A complete evaluation includes B-mode imaging, spectral Doppler, color Doppler, and power Doppler as needed of all accessible portions of each vessel. Bilateral testing is considered an integral part of a complete examination. Limited examinations for reoccurring indications may be performed as noted. The reflux portion of the exam is performed with the patient in reverse Trendelenburg.  +---------+---------------+---------+-----------+----------+--------------+ RIGHT    CompressibilityPhasicitySpontaneityPropertiesThrombus Aging +---------+---------------+---------+-----------+----------+--------------+ CFV       Full           No       Yes                                 +---------+---------------+---------+-----------+----------+--------------+ SFJ      Full                                                        +---------+---------------+---------+-----------+----------+--------------+ FV Prox  Full           No       Yes                                 +---------+---------------+---------+-----------+----------+--------------+ FV Mid   Full           No       Yes                                 +---------+---------------+---------+-----------+----------+--------------+ FV DistalFull           No       Yes                                 +---------+---------------+---------+-----------+----------+--------------+ PFV      Full                                                        +---------+---------------+---------+-----------+----------+--------------+ POP      Full           No       Yes                                 +---------+---------------+---------+-----------+----------+--------------+ PTV      Full                                                        +---------+---------------+---------+-----------+----------+--------------+  PERO     Full                                                        +---------+---------------+---------+-----------+----------+--------------+ pulsatile dopple waveforms  +---------+---------------+---------+-----------+----------+--------------+ LEFT     CompressibilityPhasicitySpontaneityPropertiesThrombus Aging +---------+---------------+---------+-----------+----------+--------------+ CFV      Full           No       Yes                                 +---------+---------------+---------+-----------+----------+--------------+ SFJ      Full                                                        +---------+---------------+---------+-----------+----------+--------------+ FV Prox  Full           No        Yes                                 +---------+---------------+---------+-----------+----------+--------------+ FV Mid   Full           No       Yes                                 +---------+---------------+---------+-----------+----------+--------------+ FV DistalFull           No       Yes                                 +---------+---------------+---------+-----------+----------+--------------+ PFV      Full                                                        +---------+---------------+---------+-----------+----------+--------------+ POP      Full           No       Yes                                 +---------+---------------+---------+-----------+----------+--------------+ PTV      Full                                                        +---------+---------------+---------+-----------+----------+--------------+ PERO     Full                                                        +---------+---------------+---------+-----------+----------+--------------+  pulsatile doppler waveforms    Summary: BILATERAL: - No evidence of deep vein thrombosis seen in the lower extremities, bilaterally. - RIGHT: - No cystic structure found in the popliteal fossa.  LEFT: - A cystic structure is found in the popliteal fossa 7.96 x 2.54 x 5.52 cm).  *See table(s) above for measurements and observations. Electronically signed by Lonni Gaskins MD on 03/05/2024 at 4:10:57 PM.    Final    ECHOCARDIOGRAM COMPLETE Result Date: 03/04/2024    ECHOCARDIOGRAM REPORT   Patient Name:   James Wright Date of Exam: 03/04/2024 Medical Rec #:  [id number]         Height:       74.0 in Accession #:    [id number]        Weight:       250.0 lb Date of Birth:  [birth year]          BSA:          2.390 m Patient Age:    58 years          BP:           137/99 mmHg Patient Gender: M                 HR:           118 bpm. Exam Location:  Inpatient Procedure: 2D Echo, Cardiac Doppler, Color  Doppler and Intracardiac            Opacification Agent (Both Spectral and Color Flow Doppler were            utilized during procedure). Indications:    I50.40* Unspecified combined systolic (congestive) and diastolic                 (congestive) heart failure  History:        Patient has no prior history of Echocardiogram examinations.                 COPD, Signs/Symptoms:Edema, Shortness of Breath and Dyspnea;                 Risk Factors:Current Smoker.  Sonographer:    Ellouise Mose RDCS Referring Phys: [id number] MAUDE MARLA DART  Sonographer Comments: Technically difficult study due to poor echo windows. Patient moving throughout exam, could not stay awake. IMPRESSIONS  1. No apical thrombus with Definity  contrast. Left ventricular ejection fraction, by estimation, is 20 to 25%. The left ventricle has severely decreased function. The left ventricle demonstrates global hypokinesis. The left ventricular internal cavity size was moderately dilated. There is mild left ventricular hypertrophy. Left ventricular diastolic parameters are indeterminate.  2. Right ventricular systolic function is low normal. The right ventricular size is normal. There is normal pulmonary artery systolic pressure. The estimated right ventricular systolic pressure is 31.2 mmHg.  3. Left atrial size was moderately dilated.  4. Right atrial size was moderately dilated.  5. The mitral valve is grossly normal. Trivial mitral valve regurgitation.  6. Tricuspid valve regurgitation is mild to moderate.  7. The aortic valve is tricuspid. Aortic valve regurgitation is mild. Aortic valve sclerosis/calcification is present, without any evidence of aortic stenosis.  8. The inferior vena cava is dilated in size with <50% respiratory variability, suggesting right atrial pressure of 15 mmHg. Comparison(s): No prior Echocardiogram. FINDINGS  Left Ventricle: No apical thrombus with Definity  contrast. Left ventricular ejection fraction, by estimation, is 20 to  25%. The left ventricle has severely decreased function. The left ventricle  demonstrates global hypokinesis. Definity  contrast agent was given IV to delineate the left ventricular endocardial borders. The left ventricular internal cavity size was moderately dilated. There is mild left ventricular hypertrophy. Left ventricular diastolic parameters are indeterminate. Right Ventricle: The right ventricular size is normal. No increase in right ventricular wall thickness. Right ventricular systolic function is low normal. There is normal pulmonary artery systolic pressure. The tricuspid regurgitant velocity is 2.01 m/s,  and with an assumed right atrial pressure of 15 mmHg, the estimated right ventricular systolic pressure is 31.2 mmHg. Left Atrium: Left atrial size was moderately dilated. Right Atrium: Right atrial size was moderately dilated. Pericardium: There is no evidence of pericardial effusion. Mitral Valve: The mitral valve is grossly normal. Trivial mitral valve regurgitation. Tricuspid Valve: The tricuspid valve is grossly normal. Tricuspid valve regurgitation is mild to moderate. Aortic Valve: The aortic valve is tricuspid. Aortic valve regurgitation is mild. Aortic valve sclerosis/calcification is present, without any evidence of aortic stenosis. Pulmonic Valve: The pulmonic valve was normal in structure. Pulmonic valve regurgitation is not visualized. Aorta: The aortic root and ascending aorta are structurally normal, with no evidence of dilitation. Venous: The inferior vena cava is dilated in size with less than 50% respiratory variability, suggesting right atrial pressure of 15 mmHg. IAS/Shunts: No atrial level shunt detected by color flow Doppler.  LEFT VENTRICLE PLAX 2D LVIDd:         6.30 cm LVIDs:         5.30 cm LV PW:         1.20 cm LV IVS:        1.10 cm LVOT diam:     2.30 cm LV SV:         72 LV SV Index:   30 LVOT Area:     4.15 cm  LV Volumes (MOD) LV vol d, MOD A2C: 226.0 ml LV vol d, MOD  A4C: 143.0 ml LV vol s, MOD A2C: 193.0 ml LV vol s, MOD A4C: 135.0 ml LV SV MOD A2C:     33.0 ml LV SV MOD A4C:     143.0 ml LV SV MOD BP:      14.6 ml RIGHT VENTRICLE             IVC RV S prime:     10.70 cm/s  IVC diam: 2.90 cm TAPSE (M-mode): 1.4 cm LEFT ATRIUM             Index        RIGHT ATRIUM           Index LA diam:        5.00 cm 2.09 cm/m   RA Area:     24.50 cm LA Vol (A2C):   85.5 ml 35.78 ml/m  RA Volume:   85.60 ml  35.82 ml/m LA Vol (A4C):   85.5 ml 35.78 ml/m LA Biplane Vol: 90.1 ml 37.70 ml/m  AORTIC VALVE LVOT Vmax:   110.90 cm/s LVOT Vmean:  78.767 cm/s LVOT VTI:    0.172 m  AORTA Ao Root diam: 3.40 cm Ao Asc diam:  3.40 cm MITRAL VALVE                TRICUSPID VALVE MV Area (PHT): 4.49 cm     TR Peak grad:   16.2 mmHg MV Decel Time: 169 msec     TR Vmax:        201.00 cm/s MV E velocity: 116.00 cm/s  SHUNTS                             Systemic VTI:  0.17 m                             Systemic Diam: 2.30 cm Vinie Maxcy MD Electronically signed by Vinie Maxcy MD Signature Date/Time: 03/04/2024/4:16:09 PM    Final    US  RENAL Result Date: 03/03/2024 CLINICAL DATA:  AKI. EXAM: RENAL / URINARY TRACT ULTRASOUND COMPLETE COMPARISON:  None Available. FINDINGS: Right Kidney: Renal measurements: 10.7 x 5.8 x 5.4 cm = volume: 173.9 mL. Increased echogenicity is noted. No mass or hydronephrosis visualized. Left Kidney: Renal measurements: 11.5 x 6.6 x 6.0 cm = volume: 239.5 mL. Increased echogenicity is noted. No hydronephrosis is seen. There is a vague complex cystic structure in the upper pole of the left kidney measuring 1.9 x 2.0 x 1.8 cm with internal echoes. Bladder: Appears normal for degree of bladder distention. Other: None. IMPRESSION: 1. Increased renal parenchymal echogenicity bilaterally, compatible with medical renal disease. 2. Slightly complex cystic structure in the upper pole of the left kidney with internal echoes measuring up to 2 cm.  Nonemergent MRI is suggested for further evaluation. Electronically Signed   By: Leita Birmingham M.D.   On: 03/03/2024 15:00   CT Angio Chest PE W and/or Wo Contrast Result Date: 03/02/2024 CLINICAL DATA:  Shortness of breath EXAM: CT ANGIOGRAPHY CHEST WITH CONTRAST TECHNIQUE: Multidetector CT imaging of the chest was performed using the standard protocol during bolus administration of intravenous contrast. Multiplanar CT image reconstructions and MIPs were obtained to evaluate the vascular anatomy. RADIATION DOSE REDUCTION: This exam was performed according to the departmental dose-optimization program which includes automated exposure control, adjustment of the mA and/or kV according to patient size and/or use of iterative reconstruction technique. CONTRAST:  75mL OMNIPAQUE  IOHEXOL  350 MG/ML SOLN COMPARISON:  Chest x-ray 03/02/2024 FINDINGS: Cardiovascular: Satisfactory opacification of the pulmonary arteries to the segmental level. No evidence of pulmonary embolism. Nonaneurysmal aorta. Mild atherosclerosis. Cardiomegaly. No significant pericardial effusion Mediastinum/Nodes: Mild bowing of the posterior trachea and slight narrowed appearance of the main bronchi. Mild bilateral bronchial wall thickening. No thyroid mass. Borderline AP window lymph nodes measuring up to 9 mm. Right low paratracheal nodes measuring up to 14 mm. Left precarinal node measuring 13 mm. Small hilar nodes, on the right measuring up to 12 mm and on the left measuring up to 11 mm. Small hiatal hernia and mild circumferential distal esophageal thickening. Lungs/Pleura: No acute focal airspace disease. No pneumothorax. Mild subpleural reticulation along the anterior upper lobes. Trace right-sided pleural effusion. Upper Abdomen: No definite acute finding, respiratory motion degradation. Indistinct appearing gallbladder. Musculoskeletal: No acute osseous abnormality. Mild generalized subcutaneous edema. Small amount of fluid and edema  superficial to the sternum Review of the MIP images confirms the above findings. IMPRESSION: 1. Negative for acute pulmonary embolus. 2. Cardiomegaly. Trace right-sided pleural effusion. 3. Mild mediastinal and hilar adenopathy, nonspecific in appearance. Consider short-term CT follow-up to assess for interval change. 4. Slight bowing of the posterior trachea and slightly narrowed appearance of the bilateral bronchi, correlate for tracheal malacia/tracheal bronchomalacia. Mild bilateral bronchial wall thickening may be seen with airways inflammatory process 5. Small hiatal hernia and mild circumferential distal esophageal thickening, question reflux or esophagitis. 6. Indistinct appearing gallbladder, appearance suspected to be related to motion artifact, if  there are symptoms referable to the right upper quadrant, ultrasound could be performed. 7. Aortic atherosclerosis. Aortic Atherosclerosis (ICD10-I70.0). Electronically Signed   By: Luke Bun M.D.   On: 03/02/2024 20:29   DG Chest Portable 1 View Result Date: 03/02/2024 EXAM: 1 VIEW(S) XRAY OF THE CHEST 03/02/2024 06:04:00 PM COMPARISON: 02/21/2024 CLINICAL HISTORY: SOB FINDINGS: LUNGS AND PLEURA: Stable interstitial prominence. No pleural effusion. No pneumothorax. HEART AND MEDIASTINUM: Cardiomegaly, unchanged. BONES AND SOFT TISSUES: No acute osseous abnormality. IMPRESSION: 1. Cardiomegaly, unchanged. 2. Stable interstitial prominence. Electronically signed by: Greig Pique MD 03/02/2024 06:59 PM EST RP Workstation: [id number]   DG Chest 2 View Result Date: 02/21/2024 CLINICAL DATA:  Dyspnea on exertion and cough. EXAM: CHEST - 2 VIEW COMPARISON:  None Available. FINDINGS: Mild cardiac enlargement. Mild pulmonary interstitial prominence may reflect chronic disease or minimal pulmonary interstitial edema. There is no evidence of overt airspace edema, consolidation, pneumothorax, nodule or pleural fluid. The visualized skeletal structures are  unremarkable. IMPRESSION: Mild cardiac enlargement. Mild pulmonary interstitial prominence may reflect chronic disease or minimal pulmonary interstitial edema. Electronically Signed   By: Marcey Moan M.D.   On: 02/21/2024 13:53    Time coordinating discharge: 40 mins  SIGNED:  Norval Bar, MD [facility] 03/06/2024, 4:20 PM     [1] No Known Allergies

## 2024-03-06 NOTE — TOC Transition Note (Signed)
 Transition of Care [facility]) - Discharge Note   Patient Details  Name: James Wright MRN: [id number] Date of Birth: 1965-08-25  Transition of Care [facility]) CM/SW Contact:  Lendia Dais, LCSWA Phone Number: 03/06/2024, 12:29 PM   Clinical Narrative: RN notified CSW that the pt left AMA.  No further TOC needs.      Final next level of care: Home/Self Care Barriers to Discharge: Other (must enter comment) (Pt left AMA)   Patient Goals and CMS Choice Patient states their goals for this hospitalization and ongoing recovery are:: Stay alive   Choice offered to / list presented to : NA      Discharge Placement                Patient to be transferred to facility by: Self Name of family member notified: Odetta (sister) Patient and family notified of of transfer: 03/06/24  Discharge Plan and Services Additional resources added to the After Visit Summary for   In-house Referral: Clinical Social Work, Orthoptist                                   Social Drivers of Health (SDOH) Interventions SDOH Screenings   Food Insecurity: No Food Insecurity (03/03/2024)  Housing: Low Risk (03/03/2024)  Transportation Needs: No Transportation Needs (03/03/2024)  Utilities: Not At Risk (03/03/2024)  Depression (PHQ2-9): Low Risk (01/10/2024)  Tobacco Use: High Risk (03/05/2024)     Readmission Risk Interventions     No data to display

## 2024-03-07 ENCOUNTER — Encounter (HOSPITAL_COMMUNITY): Admission: RE | Payer: Self-pay | Source: Home / Self Care

## 2024-03-07 ENCOUNTER — Ambulatory Visit (HOSPITAL_COMMUNITY): Admission: RE | Admit: 2024-03-07 | Source: Home / Self Care | Admitting: Internal Medicine

## 2024-03-07 SURGERY — RIGHT/LEFT HEART CATH AND CORONARY ANGIOGRAPHY
Anesthesia: LOCAL

## 2024-03-28 ENCOUNTER — Encounter: Payer: Self-pay | Admitting: Nurse Practitioner

## 2024-03-28 ENCOUNTER — Ambulatory Visit (INDEPENDENT_AMBULATORY_CARE_PROVIDER_SITE_OTHER): Admitting: Nurse Practitioner

## 2024-03-28 VITALS — BP 117/81 | HR 110 | Resp 24

## 2024-03-28 DIAGNOSIS — R600 Localized edema: Secondary | ICD-10-CM

## 2024-03-28 DIAGNOSIS — N179 Acute kidney failure, unspecified: Secondary | ICD-10-CM | POA: Diagnosis not present

## 2024-03-28 DIAGNOSIS — F1290 Cannabis use, unspecified, uncomplicated: Secondary | ICD-10-CM | POA: Diagnosis not present

## 2024-03-28 DIAGNOSIS — F17200 Nicotine dependence, unspecified, uncomplicated: Secondary | ICD-10-CM

## 2024-03-28 DIAGNOSIS — J441 Chronic obstructive pulmonary disease with (acute) exacerbation: Secondary | ICD-10-CM

## 2024-03-28 DIAGNOSIS — R590 Localized enlarged lymph nodes: Secondary | ICD-10-CM | POA: Diagnosis not present

## 2024-03-28 DIAGNOSIS — F1410 Cocaine abuse, uncomplicated: Secondary | ICD-10-CM | POA: Insufficient documentation

## 2024-03-28 DIAGNOSIS — R21 Rash and other nonspecific skin eruption: Secondary | ICD-10-CM | POA: Diagnosis not present

## 2024-03-28 DIAGNOSIS — E1165 Type 2 diabetes mellitus with hyperglycemia: Secondary | ICD-10-CM | POA: Diagnosis not present

## 2024-03-28 DIAGNOSIS — I509 Heart failure, unspecified: Secondary | ICD-10-CM | POA: Diagnosis not present

## 2024-03-28 DIAGNOSIS — N281 Cyst of kidney, acquired: Secondary | ICD-10-CM

## 2024-03-28 DIAGNOSIS — I5040 Unspecified combined systolic (congestive) and diastolic (congestive) heart failure: Secondary | ICD-10-CM | POA: Insufficient documentation

## 2024-03-28 MED ORDER — ALBUTEROL SULFATE HFA 108 (90 BASE) MCG/ACT IN AERS: 2.0000 | INHALATION_SPRAY | RESPIRATORY_TRACT | 3 refills | Status: AC | PRN

## 2024-03-28 MED ORDER — PREDNISONE 20 MG PO TABS: 20.0000 mg | ORAL_TABLET | Freq: Every day | ORAL | 0 refills | Status: AC

## 2024-03-28 NOTE — Assessment & Plan Note (Signed)
"   Mild mediastinal and hilar adenopathy, nonspecific in appearance. Consider short-term CT follow-up to assess for interval change. Will discuss getting a CT chest at next visit "

## 2024-03-28 NOTE — Assessment & Plan Note (Signed)
 Cocaine and cannabis use disorders Intermittent use of cocaine and cannabis. Advised on risks including cardiac arrest and overdose. - Encouraged cessation of cocaine and cannabis use.

## 2024-03-28 NOTE — Assessment & Plan Note (Addendum)
"    complex cystic structure in the upper pole of the left kidney. - Ordered non-emergent MRI of the left kidney.  "

## 2024-03-28 NOTE — Progress Notes (Signed)
 "  Established Patient Office Visit  Subjective:  Patient ID: James Wright, male    DOB: [birth year]  Age: 59 y.o. MRN: [id number]  CC:  Chief Complaint  Patient presents with   Hospitalization Follow-up    HPI   Discussed the use of AI scribe software for clinical note transcription with the patient, who gave verbal consent to proceed.  History of Present Illness James Wright is a 59 year old male with COPD and congestive heart failure who presents with swelling and breathing difficulties.  He was recently hospitalized from December 12 to March 06, 2024, for COPD with acute exacerbation and acute on chronic congestive heart failure. He left AMA,Since then, he has experienced generalized swelling and persistent breathing difficulties. His breathing is described as 'not the same' with ongoing wheezing and a sensation of blockage in his chest. No new medications have been started, but he mentions taking vitamins.  He has a history of congestive heart failure with an ejection fraction of 20-25%. He experiences swelling in his legs and abdomen,and has been taking Lasix  20 mg daily for fluid retention. During his hospital stay, he received IV Lasix . Despite medication, he continues to experience significant swelling.  He has a new diagnosis of diabetes with an A1c of 6.5 and currently not on medication  He reports a rash on his face that appeared two days ago, which is painful but not itchy.  His social history is significant for polysubstance use, including smoking, cocaine, and marijuana. He acknowledges occasional use of these substances. In terms of medication, he uses an albuterol  inhaler as needed, approximately once a week, and continues with Lasix  for fluid management.    Assessment & Plan     Past Medical History:  Diagnosis Date   Blindness of left eye    Hidradenitis    Marijuana smoker 12/24/2022   Tobacco use disorder, moderate, dependence 02/16/2017     Past Surgical History:  Procedure Laterality Date   BACK SURGERY      Family History  Problem Relation Age of Onset   CAD Father    Colon cancer Neg Hx    Skin cancer Neg Hx     Social History   Socioeconomic History   Marital status: Divorced    Spouse name: Not on file   Number of children: 1   Years of education: Not on file   Highest education level: Not on file  Occupational History   Not on file  Tobacco Use   Smoking status: Every Day    Current packs/day: 1.00    Types: Cigarettes   Smokeless tobacco: Never  Vaping Use   Vaping status: Never Used  Substance and Sexual Activity   Alcohol use: Not Currently    Comment: occ   Drug use: Yes    Types: Marijuana, Cocaine    Comment: daily   Sexual activity: Yes    Birth control/protection: Condom  Other Topics Concern   Not on file  Social History Narrative   Lives home alone    Social Drivers of Health   Tobacco Use: High Risk (03/28/2024)   Patient History    Smoking Tobacco Use: Every Day    Smokeless Tobacco Use: Never    Passive Exposure: Not on file  Financial Resource Strain: Not on file  Food Insecurity: No Food Insecurity (03/03/2024)   Epic    Worried About Radiation Protection Practitioner of Food in the Last Year: Never true    Ran Out  of Food in the Last Year: Never true  Transportation Needs: No Transportation Needs (03/03/2024)   Epic    Lack of Transportation (Medical): No    Lack of Transportation (Non-Medical): No  Physical Activity: Not on file  Stress: Not on file  Social Connections: Not on file  Intimate Partner Violence: Not At Risk (03/03/2024)   Epic    Fear of Current or Ex-Partner: No    Emotionally Abused: No    Physically Abused: No    Sexually Abused: No  Depression (PHQ2-9): Low Risk (03/28/2024)   Depression (PHQ2-9)    PHQ-2 Score: 0  Alcohol Screen: Not on file  Housing: Low Risk (03/03/2024)   Epic    Unable to Pay for Housing in the Last Year: No    Number of Times Moved in  the Last Year: 0    Homeless in the Last Year: No  Utilities: Not At Risk (03/03/2024)   Epic    Threatened with loss of utilities: No  Health Literacy: Not on file    Outpatient Medications Prior to Visit  Medication Sig Dispense Refill   cyanocobalamin (VITAMIN B12) 500 MCG tablet Take 1,000 mcg by mouth in the morning.     furosemide  (LASIX ) 20 MG tablet Take 20 mg by mouth in the morning.     albuterol  (VENTOLIN  HFA) 108 (90 Base) MCG/ACT inhaler Inhale 2 puffs into the lungs every 4 (four) hours as needed for wheezing or shortness of breath. 1 each 0   ascorbic acid (VITAMIN C) 1000 MG tablet Take 1,000 mg by mouth in the morning. (Patient not taking: Reported on 03/28/2024)     Multiple Vitamins-Minerals (MENS 50+ MULTIVITAMIN) TABS Take 1 tablet by mouth in the morning. (Patient not taking: Reported on 03/28/2024)     No facility-administered medications prior to visit.    Allergies[1]  ROS Review of Systems  Constitutional:  Negative for appetite change, chills, fatigue and fever.  HENT:  Negative for congestion, postnasal drip, rhinorrhea and sneezing.   Respiratory:  Positive for shortness of breath and wheezing. Negative for cough.   Cardiovascular:  Positive for leg swelling. Negative for chest pain and palpitations.  Gastrointestinal:  Negative for abdominal pain, constipation, nausea and vomiting.  Genitourinary:  Negative for difficulty urinating, dysuria, flank pain and frequency.  Musculoskeletal:  Negative for arthralgias, back pain, joint swelling and myalgias.  Skin:  Positive for rash. Negative for pallor and wound.  Neurological:  Negative for facial asymmetry, weakness, numbness and headaches.  Psychiatric/Behavioral:  Negative for behavioral problems, confusion, self-injury and suicidal ideas.       Objective:    Physical Exam Vitals and nursing note reviewed.  Constitutional:      General: He is not in acute distress.    Appearance: Normal appearance. He  is obese. He is not ill-appearing, toxic-appearing or diaphoretic.  HENT:     Mouth/Throat:     Mouth: Mucous membranes are moist.     Pharynx: Oropharynx is clear. No oropharyngeal exudate or posterior oropharyngeal erythema.  Eyes:     General: No scleral icterus.       Right eye: No discharge.        Left eye: No discharge.     Extraocular Movements: Extraocular movements intact.     Conjunctiva/sclera: Conjunctivae normal.  Cardiovascular:     Rate and Rhythm: Normal rate and regular rhythm.     Pulses: Normal pulses.     Heart sounds: Normal heart sounds. No murmur  heard.    No friction rub. No gallop.  Pulmonary:     Effort: Pulmonary effort is normal. No respiratory distress.     Breath sounds: No stridor. Wheezing and rhonchi present. No rales.  Chest:     Chest wall: No tenderness.  Abdominal:     General: There is distension.     Palpations: Abdomen is soft.     Tenderness: There is no abdominal tenderness. There is no right CVA tenderness, left CVA tenderness or guarding.  Musculoskeletal:        General: No swelling, tenderness, deformity or signs of injury.     Right lower leg: Edema present.     Left lower leg: Edema present.  Skin:    General: Skin is warm and dry.     Capillary Refill: Capillary refill takes less than 2 seconds.     Coloration: Skin is not jaundiced or pale.     Findings: Erythema and rash present. No bruising or lesion.  Neurological:     Mental Status: He is alert and oriented to person, place, and time.     Motor: No weakness.     Coordination: Coordination normal.     Gait: Gait normal.  Psychiatric:        Mood and Affect: Mood normal.        Behavior: Behavior normal.        Thought Content: Thought content normal.        Judgment: Judgment normal.     BP 117/81   Pulse (!) 110   Resp (!) 24   SpO2 93%  Wt Readings from Last 3 Encounters:  03/02/24 250 lb (113.4 kg)  01/10/24 245 lb (111.1 kg)  12/24/22 216 lb (98 kg)     Lab Results  Component Value Date   TSH 4.769 (H) 03/05/2024   Lab Results  Component Value Date   WBC 11.7 (H) 03/03/2024   HGB 15.8 03/03/2024   HCT 49.7 03/03/2024   MCV 94.1 03/03/2024   PLT 273 03/03/2024   Lab Results  Component Value Date   NA 142 03/06/2024   K 4.2 03/06/2024   CO2 30 03/06/2024   GLUCOSE 145 (H) 03/06/2024   BUN 24 (H) 03/06/2024   CREATININE 1.28 (H) 03/06/2024   BILITOT 0.2 12/24/2022   ALKPHOS 94 12/24/2022   AST 17 12/24/2022   ALT 25 12/24/2022   PROT 7.2 12/24/2022   ALBUMIN 4.2 12/24/2022   CALCIUM 8.5 (L) 03/06/2024   ANIONGAP 8 03/06/2024   EGFR 112 12/24/2022   No results found for: CHOL No results found for: HDL No results found for: LDLCALC No results found for: TRIG No results found for: CHOLHDL Lab Results  Component Value Date   HGBA1C 6.5 (H) 03/04/2024      Assessment & Plan:   Problem List Items Addressed This Visit       Cardiovascular and Mediastinum   Acute heart failure (HCC)   Heart failure with reduced ejection fraction Severely reduced ejection fraction (20-25%). Symptoms include swelling and shortness of breath. Heart rate elevated at 110 bpm. - Referred to cardiologist for further evaluation and management. - Continue Lasix  20 mg daily. - Rechecked labs to monitor kidney function.       Relevant Orders   Ambulatory referral to Cardiology   CMP14+EGFR   Mediastinal adenopathy    Mild mediastinal and hilar adenopathy, nonspecific in appearance. Consider short-term CT follow-up to assess for interval change. Will discuss getting a  CT chest at next visit        Respiratory   COPD with acute exacerbation (HCC) - Primary   COPD with acute exacerbation Recent exacerbation with hypoxia. Oxygen saturation 93-94% on room air.  Mild wheezing present. - Referred to pulmonologist for further evaluation and management. - Continue albuterol  inhaler as needed.       Relevant Medications    predniSONE  (DELTASONE ) 20 MG tablet   albuterol  (VENTOLIN  HFA) 108 (90 Base) MCG/ACT inhaler   Other Relevant Orders   Ambulatory referral to Pulmonology     Endocrine   Type 2 diabetes mellitus with hyperglycemia, without long-term current use of insulin  (HCC)   Lab Results  Component Value Date   HGBA1C 6.5 (H) 03/04/2024   Type 2 diabetes mellitus New onset diabetes with A1c of 6.5%.   - Currently not on medication, consider stating Jardiance  pending labs - Encouraged dietary modifications to avoid sugar, sweets, soda, bread, rice, and pasta. - Encouraged moderate to vigorous exercise 30 minutes, 5 days a week.       Relevant Orders   Microalbumin / creatinine urine ratio     Musculoskeletal and Integument   Rash and other nonspecific skin eruption   Facial rash New onset facial rash without itchiness. - Prescribed prednisone  20 mg daily to manage rash. - Advised to seek medical attention if throat closing or breathing difficulties occur.       Relevant Medications   predniSONE  (DELTASONE ) 20 MG tablet     Genitourinary   AKI (acute kidney injury)   Relevant Orders   CMP14+EGFR   Renal cyst     complex cystic structure in the upper pole of the left kidney. - Ordered non-emergent MRI of the left kidney.       Relevant Orders   MR ABDOMEN LIMITED     Other   Tobacco use disorder, moderate, dependence   Tobacco use disorder Continued tobacco use despite COPD diagnosis. - Encouraged smoking cessation.       Bilateral lower extremity edema   Continue furosemide  20 mg daily, DASH diet and elevation advised      Cocaine use disorder (HCC)   Cocaine and cannabis use disorders Intermittent use of cocaine and cannabis. Advised on risks including cardiac arrest and overdose. - Encouraged cessation of cocaine and cannabis use.          Cannabis use disorder   Cocaine and cannabis use disorders Intermittent use of cocaine and cannabis. Advised on risks  including cardiac arrest and overdose. - Encouraged cessation of cocaine and cannabis use.       Meds ordered this encounter  Medications   predniSONE  (DELTASONE ) 20 MG tablet    Sig: Take 1 tablet (20 mg total) by mouth daily with breakfast.    Dispense:  5 tablet    Refill:  0   albuterol  (VENTOLIN  HFA) 108 (90 Base) MCG/ACT inhaler    Sig: Inhale 2 puffs into the lungs every 4 (four) hours as needed for wheezing or shortness of breath.    Dispense:  1 each    Refill:  3    Follow-up: Return in about 4 weeks (around 04/25/2024).    Misk Galentine R Brandom Kerwin, FNP     [1] No Known Allergies  "

## 2024-03-28 NOTE — Assessment & Plan Note (Signed)
 COPD with acute exacerbation Recent exacerbation with hypoxia. Oxygen saturation 93-94% on room air.  Mild wheezing present. - Referred to pulmonologist for further evaluation and management. - Continue albuterol  inhaler as needed.

## 2024-03-28 NOTE — Assessment & Plan Note (Addendum)
 Continue furosemide  20 mg daily, DASH diet and elevation advised

## 2024-03-28 NOTE — Assessment & Plan Note (Signed)
 Facial rash New onset facial rash without itchiness. - Prescribed prednisone  20 mg daily to manage rash. - Advised to seek medical attention if throat closing or breathing difficulties occur.

## 2024-03-28 NOTE — Assessment & Plan Note (Signed)
 Lab Results  Component Value Date   HGBA1C 6.5 (H) 03/04/2024   Type 2 diabetes mellitus New onset diabetes with A1c of 6.5%.   - Currently not on medication, consider stating Jardiance  pending labs - Encouraged dietary modifications to avoid sugar, sweets, soda, bread, rice, and pasta. - Encouraged moderate to vigorous exercise 30 minutes, 5 days a week.

## 2024-03-28 NOTE — Assessment & Plan Note (Signed)
 Heart failure with reduced ejection fraction Severely reduced ejection fraction (20-25%). Symptoms include swelling and shortness of breath. Heart rate elevated at 110 bpm. - Referred to cardiologist for further evaluation and management. - Continue Lasix  20 mg daily. - Rechecked labs to monitor kidney function.

## 2024-03-28 NOTE — Patient Instructions (Signed)
 1. COPD with acute exacerbation (HCC) (Primary) - Ambulatory referral to Pulmonology - albuterol  (VENTOLIN  HFA) 108 (90 Base) MCG/ACT inhaler; Inhale 2 puffs into the lungs every 4 (four) hours as needed for wheezing or shortness of breath.  Dispense: 1 each; Refill: 3  2. Bilateral lower extremity edema  3. Tobacco use disorder, moderate, dependence  4. Acute heart failure, unspecified heart failure type [facility]) - Ambulatory referral to Cardiology - CMP14+EGFR  5. AKI (acute kidney injury) - CMP14+EGFR  6. Type 2 diabetes mellitus with hyperglycemia, without long-term current use of insulin  (HCC)  7. Rash and other nonspecific skin eruption - predniSONE  (DELTASONE ) 20 MG tablet; Take 1 tablet (20 mg total) by mouth daily with breakfast.  Dispense: 5 tablet; Refill: 0    It is important that you exercise regularly at least 30 minutes 5 times a week as tolerated  Think about what you will eat, plan ahead. Choose  clean, green, fresh or frozen over canned, processed or packaged foods which are more sugary, salty and fatty. 70 to 75% of food eaten should be vegetables and fruit. Three meals at set times with snacks allowed between meals, but they must be fruit or vegetables. Aim to eat over a 12 hour period , example 7 am to 7 pm, and STOP after  your last meal of the day. Drink water,generally about 64 ounces per day, no other drink is as healthy. Fruit juice is best enjoyed in a healthy way, by EATING the fruit.  Thanks for choosing [facility] we consider it a privelige to serve you.

## 2024-03-28 NOTE — Assessment & Plan Note (Signed)
 Tobacco use disorder Continued tobacco use despite COPD diagnosis. - Encouraged smoking cessation.

## 2024-03-29 LAB — MICROALBUMIN / CREATININE URINE RATIO
Creatinine, Urine: 178.2 mg/dL
Microalb/Creat Ratio: 524 mg/g{creat} — ABNORMAL HIGH (ref 0–29)
Microalbumin, Urine: 933 ug/mL

## 2024-03-29 LAB — CMP14+EGFR
ALT: 41 IU/L (ref 0–44)
AST: 25 IU/L (ref 0–40)
Albumin: 3.9 g/dL (ref 3.8–4.9)
Alkaline Phosphatase: 100 IU/L (ref 47–123)
BUN/Creatinine Ratio: 10 (ref 9–20)
BUN: 13 mg/dL (ref 6–24)
Bilirubin Total: 1.1 mg/dL (ref 0.0–1.2)
CO2: 26 mmol/L (ref 20–29)
Calcium: 9.3 mg/dL (ref 8.7–10.2)
Chloride: 100 mmol/L (ref 96–106)
Creatinine, Ser: 1.28 mg/dL — ABNORMAL HIGH (ref 0.76–1.27)
Globulin, Total: 2.1 g/dL (ref 1.5–4.5)
Glucose: 125 mg/dL — ABNORMAL HIGH (ref 70–99)
Potassium: 5.2 mmol/L (ref 3.5–5.2)
Sodium: 142 mmol/L (ref 134–144)
Total Protein: 6 g/dL (ref 6.0–8.5)
eGFR: 66 mL/min/1.73

## 2024-03-30 ENCOUNTER — Ambulatory Visit: Payer: Self-pay | Admitting: Nurse Practitioner

## 2024-03-30 DIAGNOSIS — I509 Heart failure, unspecified: Secondary | ICD-10-CM

## 2024-03-30 MED ORDER — FUROSEMIDE 20 MG PO TABS: ORAL_TABLET | ORAL | 1 refills | Status: DC

## 2024-03-30 MED ORDER — POTASSIUM CHLORIDE CRYS ER 20 MEQ PO TBCR: 20.0000 meq | EXTENDED_RELEASE_TABLET | Freq: Every day | ORAL | 1 refills | Status: AC

## 2024-04-04 ENCOUNTER — Ambulatory Visit: Attending: Cardiology | Admitting: Cardiology

## 2024-04-04 ENCOUNTER — Encounter: Payer: Self-pay | Admitting: Cardiology

## 2024-04-04 VITALS — BP 110/70 | HR 120 | Ht 74.0 in | Wt 286.6 lb

## 2024-04-04 DIAGNOSIS — E1165 Type 2 diabetes mellitus with hyperglycemia: Secondary | ICD-10-CM

## 2024-04-04 DIAGNOSIS — F1410 Cocaine abuse, uncomplicated: Secondary | ICD-10-CM

## 2024-04-04 DIAGNOSIS — F17200 Nicotine dependence, unspecified, uncomplicated: Secondary | ICD-10-CM | POA: Diagnosis not present

## 2024-04-04 DIAGNOSIS — I5040 Unspecified combined systolic (congestive) and diastolic (congestive) heart failure: Secondary | ICD-10-CM

## 2024-04-04 LAB — CBC
Hematocrit: 54.2 % — ABNORMAL HIGH (ref 37.5–51.0)
Hemoglobin: 17 g/dL (ref 13.0–17.7)
MCH: 29.3 pg (ref 26.6–33.0)
MCHC: 31.4 g/dL — ABNORMAL LOW (ref 31.5–35.7)
MCV: 93 fL (ref 79–97)
Platelets: 321 x10E3/uL (ref 150–450)
RBC: 5.8 x10E6/uL (ref 4.14–5.80)
RDW: 13.8 % (ref 11.6–15.4)
WBC: 12.3 x10E3/uL — ABNORMAL HIGH (ref 3.4–10.8)

## 2024-04-04 MED ORDER — LOSARTAN POTASSIUM 25 MG PO TABS: 25.0000 mg | ORAL_TABLET | Freq: Every day | ORAL | 3 refills | Status: AC

## 2024-04-04 MED ORDER — FUROSEMIDE 80 MG PO TABS: 80.0000 mg | ORAL_TABLET | Freq: Every day | ORAL | 3 refills | Status: AC

## 2024-04-04 MED ORDER — DAPAGLIFLOZIN PROPANEDIOL 10 MG PO TABS: 10.0000 mg | ORAL_TABLET | Freq: Every day | ORAL | 6 refills | Status: AC

## 2024-04-04 MED ORDER — ASPIRIN 81 MG PO TBEC: 81.0000 mg | DELAYED_RELEASE_TABLET | Freq: Every day | ORAL | Status: AC

## 2024-04-04 NOTE — H&P (View-Only) (Signed)
 " Cardiology Office Note:  .   Date:  04/05/2024  ID:  James GORMAN Louder, DOB [birth year], MRN [id number] PCP: Paseda, Folashade R, FNP  [facility] Providers Cardiologist:  Alm Clay, MD     Chief Complaint  Patient presents with   Hospitalization Follow-up    Follow-up from his diagnosis with cardiomyopathy/CHF.  Left AMA because he was bored ; referral actually placed by PCP.   Cardiomyopathy    Echo EF of 20 to 25%.  Partially diuresed but left AMA   Congestive Heart Failure    Acute combined systolic and diastolic.  Feels less dyspneic than 1 in the hospital, still has significant swelling and exertional dyspnea.    Patient Profile: .     James Wright is an obese 59 y.o. male current tobacco and THC smoker with a PMH notable for COPD, hidradenitis, recently diagnosed DM-2 (A1c 6.5) and likely NICM/HFrEF as well as a renal mass who presents here for establishing cardiology care with diagnosis of Dilated Cardiomyopathy/combined CHF at the request of Paseda, Folashade R, FNP.  He was admitted 12/12-16/2025 presented the ER with worsening exertional dyspnea over several weeks.  Had previously been to the ER and treated for possible bronchitis with steroids and Zithromycin but did not feel better.  No fever or chills..  No worsening edema associated with PND and orthopnea-noted 40 pound weight gain..  Given IV Lasix  in the ER and felt better.  Initially on 2 L of oxygen..  Revankar overloaded.  TTE showed EF of 20 to 25% with severely reduced EF and global dysfunction.  Unfortunately patient left AMA without any medications.. He was seen in the hospital by Dr. Pietro, but is now scheduled to see me as a new patient , although he is not technically new.  He was placed on higher dose of IV Lasix  and started on spironolactone  as well as Jardiance .  He was then started on Entresto  as well and then potentially beta-blocker once stabilized from his volume status.  On daily for  hospitalization he had diuresed out close to 4 L.  Plan was Right and Left Heart Catheterization with Dr. Cherrie.  Then later the plan will be to add carvedilol as CHF improved.  Unfortunately, he left AMA before this could be done.  He did not go home with any prescriptions.    James Wright was seen on January 7 by Paseda, Folashade R, FNP to discuss her rash.  She noted his hospitalization.  Continued 20 mg Lasix  but did not start back medications placed in the hospital.  Referred to cardiology and pulmonology.  Plan was to do a CT chest to follow-up mediastinal adenopathy but there was no comment about the renal mass.   Subjective  Discussed the use of AI scribe software for clinical note transcription with the patient, who gave verbal consent to proceed.  History of Present Illness The patient, with heart failure and COPD, presents with symptoms of fluid retention and shortness of breath. He is accompanied by his sister, who is his advocate.  He was hospitalized in December due to heart failure. He left the hospital against medical advice because he felt bored and did not expect to stay for a week. During his stay, he was started on medications to improve heart function, but he did not fill all his prescriptions after discharge.  He reports significant fluid retention, with swelling in his legs, thighs, abdomen, and face. He gained approximately 80 pounds in three  months, with his weight increasing from 216 pounds in October 2024 to 286 pounds currently. He was able to urinate out four liters of fluid during his hospital stay. He experiences difficulty breathing, especially when lying flat, and uses two to three pillows to sleep. No chest pain or heart racing but reports significant swelling and shortness of breath.  He has a history of cocaine use, which he stopped a couple of months ago, but he was using it as recently as September and November. He smokes about one and a half packs of  cigarettes a day and uses marijuana eight to ten times a day. He has been on disability for about 20 years following back surgery and is not currently working, although he used to be very active, enjoying activities like fishing.  He was diagnosed with COPD and diabetes during his hospital stay in December. No current chest pain or tightness but reports significant swelling and shortness of breath. He has a history of heart failure, with an ejection fraction of 20-25%.  His family history includes his father having had multiple heart attacks and a quadruple bypass surgery. His parents, Dewel and Chandler Swiderski, were concerned about his health, with his father expressing worry that he might not make it to Christmas without medical intervention.   Cardiovascular ROS: positive for - dyspnea on exertion, edema, irregular heartbeat, orthopnea, shortness of breath, and no longer able to do simple things like fishing. negative for - chest pain, palpitations, paroxysmal nocturnal dyspnea, rapid heart rate, or rapid heart rates, syncope/near syncope or TIA/amaurosis fugax.  ROS:  Review of Systems - Negative except symptoms noted above.  Seems to have a little bit of issues with anhedonia and lack of self discipline/respect.    Objective   Past Medical History - Dilated cardiomyopathy / Heart failure with reduced ejection fraction (HFrEF) with combined systolic and diastolic dysfunction - Chronic obstructive pulmonary disease (COPD) - Diabetes mellitus, Type II - Primary Hypertension - Tobacco use disorder - Cocaine (and marijuana) use disorder  Surgical History: - Back surgery: Patient had back surgery which led to disability   History of Polysubstance Abuse: Tobacco as well as THC and cocaine.  U tox is positive for THC and cocaine at the hospital. Social History - Tobacco: Current smoker, 1.5 ppd - Alcohol: Denies alcohol use - Illicit Substances: Cocaine (Has abstained from use for a  couple of months, last used in November.), Marijuana (Uses marijuana 8-10 times a day.) - Employment: Unemployed due to disability (20 years)   Current medications only include furosemide  20 mg daily, prednisone  tablet 20 mg, Klor-Con  20 mg daily, vitamin C vitamin B12 and as needed albuterol .  Studies Reviewed: SABRA   EKG Interpretation Date/Time:  Wednesday April 04 2024 08:59:10 EST Ventricular Rate:  113 PR Interval:  166 QRS Duration:  96 QT Interval:  360 QTC Calculation: 493 R Axis:   182  Text Interpretation: Sinus tachycardia Right superior axis deviation Pulmonary disease pattern Septal infarct (cited on or before 04-Apr-2024) When compared with ECG of 05-Mar-2024 08:47, QRS axis Shifted left QRS voltage has decreased Confirmed by Anner Lenis ([id number]) on 04/04/2024 9:19:30 AM    No results found for: CHOL, HDL, LDLCALC, LDLDIRECT, TRIG, CHOLHDL Lab Results  Component Value Date   NA 142 03/28/2024   K 5.2 03/28/2024   CREATININE 1.28 (H) 03/28/2024   EGFR 66 03/28/2024   GLUCOSE 125 (H) 03/28/2024   Lab Results  Component Value Date   WBC 12.3 (H)  04/04/2024   HGB 17.0 04/04/2024   HCT 54.2 (H) 04/04/2024   MCV 93 04/04/2024   PLT 321 04/04/2024    Results Labs Creatinine (02/2024): 1.28 Potassium (02/2024): 5.2  Diagnostic Echocardiogram (03/04/2024): Severely reduced LVEF of 20 to 25% with global HK.  Moderate dilation.  Unable to assess diastolic parameters.  Moderate LA dilation.  Normal PAP with severely elevated PAP of the 15 mm grade..  Moderate RA dilation.  Trivial MR.  Mild to moderate TR.  AoV calcification/sclerosis with no stenosis.  Risk Assessment/Calculations:         Physical Exam:   VS:  BP 110/70 (BP Location: Left Arm, Patient Position: Sitting, Cuff Size: Large)   Pulse (!) 120   Ht 6' 2 (1.88 m)   Wt 286 lb 9.6 oz (130 kg)   SpO2 94%   BMI 36.80 kg/m    Wt Readings from Last 3 Encounters:  04/04/24 286 lb 9.6 oz (130  kg)  03/02/24 250 lb (113.4 kg)  01/10/24 245 lb (111.1 kg)      GEN: Obese.  Somewhat ill-appearing.  Despite hearing significant health condition, seems to be either unable to comprehend or somewhat flippant. NECK: Unable to assess JVD due to body habitus; No carotid bruits CARDIAC: Very distant heart sounds.  Difficult to hear-cannot exclude gallop.  Tachycardic with regular rhythm.  otherwise normal S1 and S2 with no obvious murmurs or rubs.. RESPIRATORY: Diffuse inspiratory and expiratory rhonchi but not clear rales.  Nonlabored but overall poor air movement. ABDOMEN: Distended, tense, firm and edematous EXTREMITIES: 3+ pitting edema to the abdomen.  Unable to palpate pulses.    ASSESSMENT AND PLAN: .   CHF (congestive heart failure), NYHA class III, unspecified failure chronicity, combined (HCC) Acute combined systolic and diastolic heart failure with reduced ejection fraction (EF 20-25%) Severe heart failure with EF 20-25% indicating significant dysfunction. Recent hospitalization for fluid overload. Elevated heart rate at 120 bpm likely compensatory. Risks of invasive procedures discussed. Emphasized hospitalization for fluid management and medication optimization. Unfortunately, he himself does not really have a good understanding of the severity of his condition.  He left AMA because he got bored in the hospital .  Plan: - Increase Lasix  to 80 mg daily,-start by taking twice daily x 3 d then 80 mg daily. - Start losartan  25 mg daily for BP/afterload reduction with plans to potentially convert to Entresto  if BP tolerates. - Start SGLT2 inhibitor - Plan admit to start carvedilol when he was in the hospital, will need to improve volume status prior to starting beta-blocker given his level of anasarca and resting tachycardia. - Defer to Dr. Bensimhon, but would potentially consider Corlanor given his tachycardia, borderline blood pressure and continued anasarca. - Scheduled right and  left heart catheterization to assess coronary artery disease and measure intracardiac pressures = to be performed by Dr. Cherrie - Referred to [facility] for comprehensive management after catheterization.  - Monitor kidney function and electrolytes, especially potassium. - Educated on medication adherence and lifestyle modifications. I spent about 15 to 20 minutes explaining the pathophysiology of his condition and treatment recommendations.  I opted to avoid rupturing his undergo possible defibrillator due to extent of inflammation and his likely poor candidacy given substance abuse. Stressed importance of avoiding cocaine as well as tobacco and marijuana  Type 2 diabetes mellitus with hyperglycemia, without long-term current use of insulin  (HCC) A1c 6.5 in the hospital.  - Will start on SGLT2 inhibitor-Farxiga  10 mg daily Need  to discuss with PCP further management options, but GLP-1 agonist would be a viable option.  Cocaine use disorder (HCC) History of both cocaine and cannabis use.  Continued cocaine use despite cardiac risks. Reports cessation for a couple of months but has used in the past. - Advised cessation of cocaine use to prevent further cardiac damage and improve heart failure management. Encouraged cessation of cocaine especially due to the potential adverse effects on the heart and difficulty with titrating medications including beta-blockers.  Tobacco use disorder, moderate, dependence Smoking cessation instruction/counseling given:  counseled patient on the dangers of tobacco use, advised patient to stop smoking, and reviewed strategies to maximize success   Orders Placed This Encounter  Procedures   CBC   AMB referral to [facility]   EKG 12-Lead       Informed Consent   Shared Decision Making/Informed Consent The risks, including but not limited to, [bleeding or vascular complications (1 in 500), pneumothorax (1 in 1600), arrhythmia (1 in 1000) and  death (1 in 5000)], benefits (diagnostic support and/or management of heart failure, pulmonary hypertension) and alternatives of a right heart catheterization were discussed in detail with James Wright and he is willing to proceed. The risks [stroke (1 in 1000), death (1 in 1000), kidney failure [usually temporary] (1 in 500), bleeding (1 in 200), allergic reaction [possibly serious] (1 in 200)], benefits (diagnostic support and management of coronary artery disease) and alternatives of a cardiac catheterization were discussed in detail with James Wright and he is willing to proceed.      Follow-Up: Return in about 2 weeks (around 04/18/2024) for Post cath visit with APP.  I spent 70 minutes in the care of James Wright today including reviewing labs (2 minutes), reviewing studies (5 minutes echocardiogram and other inpatient studies), face to face time discussing treatment options (26 minutes), reviewing records from hospitalization H&P, consult notes, studies as well as PCP note (12 minutes), 16 minutes, and documenting in the encounter.      Signed, Alm MICAEL Clay, MD, MS Alm Clay, M.D., M.S. Interventional Cardiologist  [facility] Pager # [phone]      "

## 2024-04-04 NOTE — Progress Notes (Signed)
 " Cardiology Office Note:  .   Date:  04/05/2024  ID:  James Wright Louder, DOB [birth year], MRN [id number] PCP: Paseda, Folashade R, FNP  [facility] Providers Cardiologist:  Alm Clay, MD     Chief Complaint  Patient presents with   Hospitalization Follow-up    Follow-up from his diagnosis with cardiomyopathy/CHF.  Left AMA because he was bored ; referral actually placed by PCP.   Cardiomyopathy    Echo EF of 20 to 25%.  Partially diuresed but left AMA   Congestive Heart Failure    Acute combined systolic and diastolic.  Feels less dyspneic than 1 in the hospital, still has significant swelling and exertional dyspnea.    Patient Profile: .     James Wright is an obese 59 y.o. male current tobacco and THC smoker with a PMH notable for COPD, hidradenitis, recently diagnosed DM-2 (A1c 6.5) and likely NICM/HFrEF as well as a renal mass who presents here for establishing cardiology care with diagnosis of Dilated Cardiomyopathy/combined CHF at the request of Paseda, Folashade R, FNP.  He was admitted 12/12-16/2025 presented the ER with worsening exertional dyspnea over several weeks.  Had previously been to the ER and treated for possible bronchitis with steroids and Zithromycin but did not feel better.  No fever or chills..  No worsening edema associated with PND and orthopnea-noted 40 pound weight gain..  Given IV Lasix  in the ER and felt better.  Initially on 2 L of oxygen..  Revankar overloaded.  TTE showed EF of 20 to 25% with severely reduced EF and global dysfunction.  Unfortunately patient left AMA without any medications.. He was seen in the hospital by Dr. Pietro, but is now scheduled to see me as a new patient , although he is not technically new.  He was placed on higher dose of IV Lasix  and started on spironolactone  as well as Jardiance .  He was then started on Entresto  as well and then potentially beta-blocker once stabilized from his volume status.  On daily for  hospitalization he had diuresed out close to 4 L.  Plan was Right and Left Heart Catheterization with Dr. Cherrie.  Then later the plan will be to add carvedilol as CHF improved.  Unfortunately, he left AMA before this could be done.  He did not go home with any prescriptions.    James Wright was seen on January 7 by Paseda, Folashade R, FNP to discuss her rash.  She noted his hospitalization.  Continued 20 mg Lasix  but did not start back medications placed in the hospital.  Referred to cardiology and pulmonology.  Plan was to do a CT chest to follow-up mediastinal adenopathy but there was no comment about the renal mass.   Subjective  Discussed the use of AI scribe software for clinical note transcription with the patient, who gave verbal consent to proceed.  History of Present Illness The patient, with heart failure and COPD, presents with symptoms of fluid retention and shortness of breath. He is accompanied by his sister, who is his advocate.  He was hospitalized in December due to heart failure. He left the hospital against medical advice because he felt bored and did not expect to stay for a week. During his stay, he was started on medications to improve heart function, but he did not fill all his prescriptions after discharge.  He reports significant fluid retention, with swelling in his legs, thighs, abdomen, and face. He gained approximately 80 pounds in three  months, with his weight increasing from 216 pounds in October 2024 to 286 pounds currently. He was able to urinate out four liters of fluid during his hospital stay. He experiences difficulty breathing, especially when lying flat, and uses two to three pillows to sleep. No chest pain or heart racing but reports significant swelling and shortness of breath.  He has a history of cocaine use, which he stopped a couple of months ago, but he was using it as recently as September and November. He smokes about one and a half packs of  cigarettes a day and uses marijuana eight to ten times a day. He has been on disability for about 20 years following back surgery and is not currently working, although he used to be very active, enjoying activities like fishing.  He was diagnosed with COPD and diabetes during his hospital stay in December. No current chest pain or tightness but reports significant swelling and shortness of breath. He has a history of heart failure, with an ejection fraction of 20-25%.  His family history includes his father having had multiple heart attacks and a quadruple bypass surgery. His parents, Dewel and Chandler Swiderski, were concerned about his health, with his father expressing worry that he might not make it to Christmas without medical intervention.   Cardiovascular ROS: positive for - dyspnea on exertion, edema, irregular heartbeat, orthopnea, shortness of breath, and no longer able to do simple things like fishing. negative for - chest pain, palpitations, paroxysmal nocturnal dyspnea, rapid heart rate, or rapid heart rates, syncope/near syncope or TIA/amaurosis fugax.  ROS:  Review of Systems - Negative except symptoms noted above.  Seems to have a little bit of issues with anhedonia and lack of self discipline/respect.    Objective   Past Medical History - Dilated cardiomyopathy / Heart failure with reduced ejection fraction (HFrEF) with combined systolic and diastolic dysfunction - Chronic obstructive pulmonary disease (COPD) - Diabetes mellitus, Type II - Primary Hypertension - Tobacco use disorder - Cocaine (and marijuana) use disorder  Surgical History: - Back surgery: Patient had back surgery which led to disability   History of Polysubstance Abuse: Tobacco as well as THC and cocaine.  U tox is positive for THC and cocaine at the hospital. Social History - Tobacco: Current smoker, 1.5 ppd - Alcohol: Denies alcohol use - Illicit Substances: Cocaine (Has abstained from use for a  couple of months, last used in November.), Marijuana (Uses marijuana 8-10 times a day.) - Employment: Unemployed due to disability (20 years)   Current medications only include furosemide  20 mg daily, prednisone  tablet 20 mg, Klor-Con  20 mg daily, vitamin C vitamin B12 and as needed albuterol .  Studies Reviewed: SABRA   EKG Interpretation Date/Time:  Wednesday April 04 2024 08:59:10 EST Ventricular Rate:  113 PR Interval:  166 QRS Duration:  96 QT Interval:  360 QTC Calculation: 493 R Axis:   182  Text Interpretation: Sinus tachycardia Right superior axis deviation Pulmonary disease pattern Septal infarct (cited on or before 04-Apr-2024) When compared with ECG of 05-Mar-2024 08:47, QRS axis Shifted left QRS voltage has decreased Confirmed by Anner Lenis ([id number]) on 04/04/2024 9:19:30 AM    No results found for: CHOL, HDL, LDLCALC, LDLDIRECT, TRIG, CHOLHDL Lab Results  Component Value Date   NA 142 03/28/2024   K 5.2 03/28/2024   CREATININE 1.28 (H) 03/28/2024   EGFR 66 03/28/2024   GLUCOSE 125 (H) 03/28/2024   Lab Results  Component Value Date   WBC 12.3 (H)  04/04/2024   HGB 17.0 04/04/2024   HCT 54.2 (H) 04/04/2024   MCV 93 04/04/2024   PLT 321 04/04/2024    Results Labs Creatinine (02/2024): 1.28 Potassium (02/2024): 5.2  Diagnostic Echocardiogram (03/04/2024): Severely reduced LVEF of 20 to 25% with global HK.  Moderate dilation.  Unable to assess diastolic parameters.  Moderate LA dilation.  Normal PAP with severely elevated PAP of the 15 mm grade..  Moderate RA dilation.  Trivial MR.  Mild to moderate TR.  AoV calcification/sclerosis with no stenosis.  Risk Assessment/Calculations:         Physical Exam:   VS:  BP 110/70 (BP Location: Left Arm, Patient Position: Sitting, Cuff Size: Large)   Pulse (!) 120   Ht 6' 2 (1.88 m)   Wt 286 lb 9.6 oz (130 kg)   SpO2 94%   BMI 36.80 kg/m    Wt Readings from Last 3 Encounters:  04/04/24 286 lb 9.6 oz (130  kg)  03/02/24 250 lb (113.4 kg)  01/10/24 245 lb (111.1 kg)      GEN: Obese.  Somewhat ill-appearing.  Despite hearing significant health condition, seems to be either unable to comprehend or somewhat flippant. NECK: Unable to assess JVD due to body habitus; No carotid bruits CARDIAC: Very distant heart sounds.  Difficult to hear-cannot exclude gallop.  Tachycardic with regular rhythm.  otherwise normal S1 and S2 with no obvious murmurs or rubs.. RESPIRATORY: Diffuse inspiratory and expiratory rhonchi but not clear rales.  Nonlabored but overall poor air movement. ABDOMEN: Distended, tense, firm and edematous EXTREMITIES: 3+ pitting edema to the abdomen.  Unable to palpate pulses.    ASSESSMENT AND PLAN: .   CHF (congestive heart failure), NYHA class III, unspecified failure chronicity, combined (HCC) Acute combined systolic and diastolic heart failure with reduced ejection fraction (EF 20-25%) Severe heart failure with EF 20-25% indicating significant dysfunction. Recent hospitalization for fluid overload. Elevated heart rate at 120 bpm likely compensatory. Risks of invasive procedures discussed. Emphasized hospitalization for fluid management and medication optimization. Unfortunately, he himself does not really have a good understanding of the severity of his condition.  He left AMA because he got bored in the hospital .  Plan: - Increase Lasix  to 80 mg daily,-start by taking twice daily x 3 d then 80 mg daily. - Start losartan  25 mg daily for BP/afterload reduction with plans to potentially convert to Entresto  if BP tolerates. - Start SGLT2 inhibitor - Plan admit to start carvedilol when he was in the hospital, will need to improve volume status prior to starting beta-blocker given his level of anasarca and resting tachycardia. - Defer to Dr. Bensimhon, but would potentially consider Corlanor given his tachycardia, borderline blood pressure and continued anasarca. - Scheduled right and  left heart catheterization to assess coronary artery disease and measure intracardiac pressures = to be performed by Dr. Cherrie - Referred to [facility] for comprehensive management after catheterization.  - Monitor kidney function and electrolytes, especially potassium. - Educated on medication adherence and lifestyle modifications. I spent about 15 to 20 minutes explaining the pathophysiology of his condition and treatment recommendations.  I opted to avoid rupturing his undergo possible defibrillator due to extent of inflammation and his likely poor candidacy given substance abuse. Stressed importance of avoiding cocaine as well as tobacco and marijuana  Type 2 diabetes mellitus with hyperglycemia, without long-term current use of insulin  (HCC) A1c 6.5 in the hospital.  - Will start on SGLT2 inhibitor-Farxiga  10 mg daily Need  to discuss with PCP further management options, but GLP-1 agonist would be a viable option.  Cocaine use disorder (HCC) History of both cocaine and cannabis use.  Continued cocaine use despite cardiac risks. Reports cessation for a couple of months but has used in the past. - Advised cessation of cocaine use to prevent further cardiac damage and improve heart failure management. Encouraged cessation of cocaine especially due to the potential adverse effects on the heart and difficulty with titrating medications including beta-blockers.  Tobacco use disorder, moderate, dependence Smoking cessation instruction/counseling given:  counseled patient on the dangers of tobacco use, advised patient to stop smoking, and reviewed strategies to maximize success   Orders Placed This Encounter  Procedures   CBC   AMB referral to [facility]   EKG 12-Lead       Informed Consent   Shared Decision Making/Informed Consent The risks, including but not limited to, [bleeding or vascular complications (1 in 500), pneumothorax (1 in 1600), arrhythmia (1 in 1000) and  death (1 in 5000)], benefits (diagnostic support and/or management of heart failure, pulmonary hypertension) and alternatives of a right heart catheterization were discussed in detail with Mr. Solly and he is willing to proceed. The risks [stroke (1 in 1000), death (1 in 1000), kidney failure [usually temporary] (1 in 500), bleeding (1 in 200), allergic reaction [possibly serious] (1 in 200)], benefits (diagnostic support and management of coronary artery disease) and alternatives of a cardiac catheterization were discussed in detail with Mr. Stemm and he is willing to proceed.      Follow-Up: Return in about 2 weeks (around 04/18/2024) for Post cath visit with APP.  I spent 70 minutes in the care of James Wright Vicci today including reviewing labs (2 minutes), reviewing studies (5 minutes echocardiogram and other inpatient studies), face to face time discussing treatment options (26 minutes), reviewing records from hospitalization H&P, consult notes, studies as well as PCP note (12 minutes), 16 minutes, and documenting in the encounter.      Signed, Alm MICAEL Clay, MD, MS Alm Clay, M.D., M.S. Interventional Cardiologist  [facility] Pager # [phone]      "

## 2024-04-04 NOTE — Patient Instructions (Signed)
 Medication Instructions:  Change and take Lasix  ( furosemide  ) 80 mg  daily for the first 3 days take it twice a day   Start taking Farxiga  10 mg daily   Start taking Losartan  25 mg daily   *If you need a refill on your cardiac medications before your next appointment, please call your pharmacy*   Lab Work: CBC If you have labs (blood work) drawn today and your tests are completely normal, you will receive your results only by: MyChart Message (if you have MyChart) OR A paper copy in the mail If you have any lab test that is abnormal or we need to change your treatment, we will call you to review the results.   Testing/Procedures:  Schedule  for Cath  !/20/26 Your physician has requested that you have a right and left  cardiac catheterization. Cardiac catheterization is used to diagnose and/or treat various heart conditions. Doctors may recommend this procedure for a number of different reasons. The most common reason is to evaluate chest pain. Chest pain can be a symptom of coronary artery disease (CAD), and cardiac catheterization can show whether plaque is narrowing or blocking your hearts arteries. This procedure is also used to evaluate the valves, as well as measure the blood flow and oxygen levels in different parts of your heart. Please follow instruction sheet, as given.   Follow-Up: At [facility], you and your health needs are our priority.  As part of our continuing mission to provide you with exceptional heart care, we have created designated Provider Care Teams.  These Care Teams include your primary Cardiologist (physician) and Advanced Practice Providers (APPs -  Physician Assistants and Nurse Practitioners) who all work together to provide you with the care you need, when you need it.     Your next appointment:   2 week(s)  The format for your next appointment:   In Person  Provider:   Alm Clay, MD or Orren Fabry, PA-C, Aline Door, PA-C, Thom Sluder,  PA-C, Kathleen Cabanilla, PA-C, or Damien Braver, NP         Other Instructions   [facility] A DEPT OF [facility]CARE AT [facility] A DEPT OF THE [place] [address] [place] [address] Dept: [phone] Loc: [phone]  TRAVARIS KOSH  04/04/2024  You are scheduled for a RIGHT AND LEFT Cardiac Catheterization on Tuesday, January 20 with Dr. Toribio Fuel.  1. Please arrive at the [facility] (Main Entrance A) at [facility]: [address] [place], [place] [address] at 5:30 AM (This time is 2 hour(s) before your procedure to ensure your preparation).   Free valet parking service is available. You will check in at ADMITTING. The support person will be asked to wait in the waiting room.  It is OK to have someone drop you off and come back when you are ready to be discharged.    Special note: Every effort is made to have your procedure done on time. Please understand that emergencies sometimes delay scheduled procedures.  2. Diet: Nothing to eat after midnight.   3. Hydration: On January 20, you may drink approved liquids (see below) until 2 hours before the procedure with 8 oz of water as your last intake.   List of approved liquids water, clear juice, clear tea, black coffee, fruit juices, non-citric and without pulp, carbonated beverages, Gatorade, Kool -Aid, plain Jello-O and plain ice popsicles.  4.  Labs: You will need to have blood drawn on , January 14 at [facility] (1st Floor), [address] [place], [place] [address]. You do not need to be fasting.  5. Medication instructions in preparation for your procedure:   Contrast Allergy: No       Stop taking, Cozaar  (Losartan ) Monday, January 20,, Lasix  (Furosemide )  Monday, January 20,    Farxiga  Friday, January 16,  On the morning of your procedure, take your Aspirin  81 mg and any morning medicines NOT  listed above.  You may use sips of water.  6. Plan to go home the same day, you will only stay overnight if medically necessary. 7. Bring a current list of your medications and current insurance cards. 8. You MUST have a responsible person to drive you home. 9. Someone MUST be with you the first 24 hours after you arrive home or your discharge will be delayed. 10. Please wear clothes that are easy to get on and off and wear slip-on shoes.  Thank you for allowing us  to care for you!   -- [facility]

## 2024-04-05 ENCOUNTER — Ambulatory Visit: Payer: Self-pay | Admitting: Cardiology

## 2024-04-05 NOTE — Assessment & Plan Note (Signed)
 Acute combined systolic and diastolic heart failure with reduced ejection fraction (EF 20-25%) Severe heart failure with EF 20-25% indicating significant dysfunction. Recent hospitalization for fluid overload. Elevated heart rate at 120 bpm likely compensatory. Risks of invasive procedures discussed. Emphasized hospitalization for fluid management and medication optimization. Unfortunately, he himself does not really have a good understanding of the severity of his condition.  He left AMA because he got bored in the hospital .  Plan: - Increase Lasix  to 80 mg daily,-start by taking twice daily x 3 d then 80 mg daily. - Start losartan  25 mg daily for BP/afterload reduction with plans to potentially convert to Entresto  if BP tolerates. - Start SGLT2 inhibitor - Plan admit to start carvedilol when he was in the hospital, will need to improve volume status prior to starting beta-blocker given his level of anasarca and resting tachycardia. - Defer to Dr. Bensimhon, but would potentially consider Corlanor given his tachycardia, borderline blood pressure and continued anasarca. - Scheduled right and left heart catheterization to assess coronary artery disease and measure intracardiac pressures = to be performed by Dr. Cherrie - Referred to [facility] for comprehensive management after catheterization.  - Monitor kidney function and electrolytes, especially potassium. - Educated on medication adherence and lifestyle modifications. I spent about 15 to 20 minutes explaining the pathophysiology of his condition and treatment recommendations.  I opted to avoid rupturing his undergo possible defibrillator due to extent of inflammation and his likely poor candidacy given substance abuse. Stressed importance of avoiding cocaine as well as tobacco and marijuana

## 2024-04-05 NOTE — Assessment & Plan Note (Signed)
 A1c 6.5 in the hospital.  - Will start on SGLT2 inhibitor-Farxiga  10 mg daily Need to discuss with PCP further management options, but GLP-1 agonist would be a viable option.

## 2024-04-05 NOTE — Assessment & Plan Note (Signed)
 Smoking cessation instruction/counseling given:  counseled patient on the dangers of tobacco use, advised patient to stop smoking, and reviewed strategies to maximize success

## 2024-04-05 NOTE — Assessment & Plan Note (Addendum)
 History of both cocaine and cannabis use.  Continued cocaine use despite cardiac risks. Reports cessation for a couple of months but has used in the past. - Advised cessation of cocaine use to prevent further cardiac damage and improve heart failure management. Encouraged cessation of cocaine especially due to the potential adverse effects on the heart and difficulty with titrating medications including beta-blockers.

## 2024-04-09 ENCOUNTER — Telehealth: Payer: Self-pay | Admitting: *Deleted

## 2024-04-09 NOTE — Telephone Encounter (Addendum)
 Cardiac Catheterization scheduled at [facility] for: Tuesday April 10, 2024 7:30 AM Arrival time [facility] Main Entrance A at: 5:30 AM  Diet: -Nothing to eat after midnight.  Hydration: -May drink clear liquids until 2 hours before the procedure.  Approved liquids: Water, clear tea, black coffee, fruit juices-non-citric and without pulp,Gatorade, plain Jello/popsicles.   -Please drink 8 oz of water 2 hours before procedure.  Medication instructions: -Hold:  Farxiga /Lasix /KCl-AM of procedure -Other usual morning medications can be taken including aspirin  81 mg.  Plan to go home the same day, you will only stay overnight if medically necessary.  You must have responsible adult to drive you home.  Someone must be with you the first 24 hours after you arrive home.  Reviewed procedure instructions with patient.

## 2024-04-10 ENCOUNTER — Other Ambulatory Visit: Payer: Self-pay

## 2024-04-10 ENCOUNTER — Ambulatory Visit (HOSPITAL_COMMUNITY)
Admission: RE | Admit: 2024-04-10 | Discharge: 2024-04-10 | Disposition: A | Discharge status: Home / Self Care | Attending: Internal Medicine | Admitting: Internal Medicine

## 2024-04-10 ENCOUNTER — Other Ambulatory Visit (HOSPITAL_COMMUNITY): Payer: Self-pay | Admitting: Adult Health

## 2024-04-10 ENCOUNTER — Encounter (HOSPITAL_COMMUNITY)
Admission: RE | Disposition: A | Discharge status: Home / Self Care | Payer: Self-pay | Source: Home / Self Care | Attending: Internal Medicine

## 2024-04-10 DIAGNOSIS — I420 Dilated cardiomyopathy: Secondary | ICD-10-CM | POA: Diagnosis not present

## 2024-04-10 DIAGNOSIS — F1410 Cocaine abuse, uncomplicated: Secondary | ICD-10-CM | POA: Insufficient documentation

## 2024-04-10 DIAGNOSIS — I5041 Acute combined systolic (congestive) and diastolic (congestive) heart failure: Secondary | ICD-10-CM | POA: Diagnosis not present

## 2024-04-10 DIAGNOSIS — Z7984 Long term (current) use of oral hypoglycemic drugs: Secondary | ICD-10-CM | POA: Diagnosis not present

## 2024-04-10 DIAGNOSIS — I110 Hypertensive heart disease with heart failure: Secondary | ICD-10-CM | POA: Insufficient documentation

## 2024-04-10 DIAGNOSIS — I509 Heart failure, unspecified: Secondary | ICD-10-CM

## 2024-04-10 DIAGNOSIS — F17210 Nicotine dependence, cigarettes, uncomplicated: Secondary | ICD-10-CM | POA: Diagnosis not present

## 2024-04-10 DIAGNOSIS — J449 Chronic obstructive pulmonary disease, unspecified: Secondary | ICD-10-CM | POA: Diagnosis not present

## 2024-04-10 DIAGNOSIS — I5020 Unspecified systolic (congestive) heart failure: Secondary | ICD-10-CM

## 2024-04-10 DIAGNOSIS — E1165 Type 2 diabetes mellitus with hyperglycemia: Secondary | ICD-10-CM | POA: Insufficient documentation

## 2024-04-10 DIAGNOSIS — F1290 Cannabis use, unspecified, uncomplicated: Secondary | ICD-10-CM | POA: Diagnosis not present

## 2024-04-10 DIAGNOSIS — Z79899 Other long term (current) drug therapy: Secondary | ICD-10-CM | POA: Insufficient documentation

## 2024-04-10 HISTORY — PX: RIGHT/LEFT HEART CATH AND CORONARY ANGIOGRAPHY: CATH118266

## 2024-04-10 LAB — POCT I-STAT EG7
Acid-Base Excess: 4 mmol/L — ABNORMAL HIGH (ref 0.0–2.0)
Acid-Base Excess: 0 mmol/L (ref 0.0–2.0)
Bicarbonate: 30.4 mmol/L — ABNORMAL HIGH (ref 20.0–28.0)
Bicarbonate: 27.2 mmol/L (ref 20.0–28.0)
Calcium, Ion: 1.2 mmol/L (ref 1.15–1.40)
Calcium, Ion: 1.2 mmol/L (ref 1.15–1.40)
HCT: 54 % — ABNORMAL HIGH (ref 39.0–52.0)
HCT: 54 % — ABNORMAL HIGH (ref 39.0–52.0)
Hemoglobin: 18.4 g/dL — ABNORMAL HIGH (ref 13.0–17.0)
Hemoglobin: 18.4 g/dL — ABNORMAL HIGH (ref 13.0–17.0)
O2 Saturation: 60 %
O2 Saturation: 60 %
Potassium: 3.9 mmol/L (ref 3.5–5.1)
Potassium: 3.9 mmol/L (ref 3.5–5.1)
Sodium: 140 mmol/L (ref 135–145)
Sodium: 140 mmol/L (ref 135–145)
TCO2: 32 mmol/L (ref 22–32)
TCO2: 29 mmol/L (ref 22–32)
pCO2, Ven: 51.3 mmHg (ref 44–60)
pCO2, Ven: 50.2 mmHg (ref 44–60)
pH, Ven: 7.381 (ref 7.25–7.43)
pH, Ven: 7.342 (ref 7.25–7.43)
pO2, Ven: 32 mmHg (ref 32–45)
pO2, Ven: 33 mmHg (ref 32–45)

## 2024-04-10 LAB — POCT I-STAT 7, (LYTES, BLD GAS, ICA,H+H)
Acid-base deficit: 1 mmol/L (ref 0.0–2.0)
Bicarbonate: 24.8 mmol/L (ref 20.0–28.0)
Calcium, Ion: 0.96 mmol/L — ABNORMAL LOW (ref 1.15–1.40)
HCT: 50 % (ref 39.0–52.0)
Hemoglobin: 17 g/dL (ref 13.0–17.0)
O2 Saturation: 88 %
Potassium: 3.3 mmol/L — ABNORMAL LOW (ref 3.5–5.1)
Sodium: 144 mmol/L (ref 135–145)
TCO2: 26 mmol/L (ref 22–32)
pCO2 arterial: 44.4 mmHg (ref 32–48)
pH, Arterial: 7.354 (ref 7.35–7.45)
pO2, Arterial: 58 mmHg — ABNORMAL LOW (ref 83–108)

## 2024-04-10 LAB — GLUCOSE, CAPILLARY
Glucose-Capillary: 162 mg/dL — ABNORMAL HIGH (ref 70–99)
Glucose-Capillary: 159 mg/dL — ABNORMAL HIGH (ref 70–99)

## 2024-04-10 MED ORDER — SODIUM CHLORIDE 0.9% FLUSH: 3.0000 mL | Freq: Two times a day (BID) | INTRAVENOUS | Status: DC

## 2024-04-10 MED ORDER — LIDOCAINE HCL (PF) 1 % IJ SOLN
INTRAMUSCULAR | Status: DC | PRN
  Administered 2024-04-10 (×2): 2 mL

## 2024-04-10 MED ORDER — SODIUM CHLORIDE 0.9% FLUSH: 3.0000 mL | INTRAVENOUS | Status: DC | PRN

## 2024-04-10 MED ORDER — HYDRALAZINE HCL 20 MG/ML IJ SOLN: 10.0000 mg | INTRAMUSCULAR | Status: DC | PRN

## 2024-04-10 MED ORDER — HEPARIN SODIUM (PORCINE) 1000 UNIT/ML IJ SOLN
INTRAMUSCULAR | Status: DC | PRN
  Administered 2024-04-10: 6000 [IU] via INTRAVENOUS

## 2024-04-10 MED ORDER — HEPARIN (PORCINE) IN NACL 1000-0.9 UT/500ML-% IV SOLN
INTRAVENOUS | Status: DC | PRN
  Administered 2024-04-10 (×2): 500 mL

## 2024-04-10 MED ORDER — FENTANYL CITRATE (PF) 100 MCG/2ML IJ SOLN
INTRAMUSCULAR | Status: AC
  Filled 2024-04-10: qty 2

## 2024-04-10 MED ORDER — IOHEXOL 350 MG/ML SOLN
INTRAVENOUS | Status: DC | PRN
  Administered 2024-04-10: 45 mL

## 2024-04-10 MED ORDER — VERAPAMIL HCL 2.5 MG/ML IV SOLN
INTRAVENOUS | Status: AC
  Filled 2024-04-10: qty 2

## 2024-04-10 MED ORDER — VERAPAMIL HCL 2.5 MG/ML IV SOLN
INTRAVENOUS | Status: DC | PRN
  Administered 2024-04-10: 10 mL via INTRA_ARTERIAL

## 2024-04-10 MED ORDER — ASPIRIN 81 MG PO CHEW
81.0000 mg | CHEWABLE_TABLET | ORAL | Status: AC
  Administered 2024-04-10: 81 mg via ORAL
  Filled 2024-04-10: qty 1

## 2024-04-10 MED ORDER — SODIUM CHLORIDE 0.9 % IV SOLN: 250.0000 mL | INTRAVENOUS | Status: DC | PRN

## 2024-04-10 MED ORDER — MIDAZOLAM HCL (PF) 2 MG/2ML IJ SOLN
INTRAMUSCULAR | Status: DC | PRN
  Administered 2024-04-10: 1 mg via INTRAVENOUS

## 2024-04-10 MED ORDER — MIDAZOLAM HCL 2 MG/2ML IJ SOLN
INTRAMUSCULAR | Status: AC
  Filled 2024-04-10: qty 2

## 2024-04-10 MED ORDER — HEPARIN SODIUM (PORCINE) 1000 UNIT/ML IJ SOLN
INTRAMUSCULAR | Status: AC
  Filled 2024-04-10: qty 10

## 2024-04-10 MED ORDER — FENTANYL CITRATE (PF) 100 MCG/2ML IJ SOLN
INTRAMUSCULAR | Status: DC | PRN
  Administered 2024-04-10: 25 ug via INTRAVENOUS

## 2024-04-10 MED ORDER — ACETAMINOPHEN 325 MG PO TABS: 650.0000 mg | ORAL_TABLET | ORAL | Status: DC | PRN

## 2024-04-10 MED ORDER — ASPIRIN 81 MG PO CHEW: 81.0000 mg | CHEWABLE_TABLET | ORAL | Status: DC

## 2024-04-10 MED ORDER — LABETALOL HCL 5 MG/ML IV SOLN: 10.0000 mg | INTRAVENOUS | Status: DC | PRN

## 2024-04-10 MED ORDER — ONDANSETRON HCL 4 MG/2ML IJ SOLN: 4.0000 mg | Freq: Four times a day (QID) | INTRAMUSCULAR | Status: DC | PRN

## 2024-04-10 MED ORDER — LIDOCAINE HCL (PF) 1 % IJ SOLN
INTRAMUSCULAR | Status: AC
  Filled 2024-04-10: qty 30

## 2024-04-10 NOTE — Discharge Instructions (Signed)

## 2024-04-10 NOTE — Progress Notes (Signed)
 TR band removed at 1030, gauze dressing applied. Right radial level 0, clean, dry, and intact.

## 2024-04-10 NOTE — Interval H&P Note (Signed)
 History and Physical Interval Note:  04/10/2024 7:46 AM  James Wright  has presented today for surgery, with the diagnosis of heart failure.  The various methods of treatment have been discussed with the patient and family. After consideration of risks, benefits and other options for treatment, the patient has consented to  Procedures: RIGHT/LEFT HEART CATH AND CORONARY ANGIOGRAPHY (N/A) as a surgical intervention.  The patient's history has been reviewed, patient examined, no change in status, stable for surgery.  I have reviewed the patient's chart and labs.  Questions were answered to the patient's satisfaction.     Abrey Bradway

## 2024-04-11 ENCOUNTER — Ambulatory Visit: Payer: Self-pay | Admitting: Nurse Practitioner

## 2024-04-11 ENCOUNTER — Encounter (HOSPITAL_COMMUNITY): Payer: Self-pay | Admitting: Internal Medicine

## 2024-04-12 ENCOUNTER — Telehealth (HOSPITAL_COMMUNITY): Payer: Self-pay

## 2024-04-12 NOTE — Telephone Encounter (Signed)
 I was able to speak to him today and he would like a visit starting next week for either wed or thurs.  Will call him back next week to set up that appointment.   Izetta Quivers, EMT-Paramedic  [phone] 04/12/2024

## 2024-04-12 NOTE — Telephone Encounter (Signed)
 New referral received and accepted.  Paramedic will reach out within 24 business hrs to set up visit.   Izetta Quivers, Paramedic [phone] 04/12/24

## 2024-04-17 ENCOUNTER — Telehealth (HOSPITAL_COMMUNITY): Payer: Self-pay

## 2024-04-17 ENCOUNTER — Telehealth (HOSPITAL_COMMUNITY): Payer: Self-pay | Admitting: Licensed Clinical Social Worker

## 2024-04-17 NOTE — Progress Notes (Incomplete)
 "  [facility] CONSULT NOTE   Primary Care: Paseda, Folashade R, FNP Primary Cardiologist: Alm Clay, MD HF Cardiologist: Dr. Cherrie  HPI: James Wright is a 59 y.o. male with history of substance abuse, left eye blindness and newly diagnosed systolic heart failure.  Saw PCP 10/25 with LEE x 2 months, increased SOB and dry cough. Treated for acute bronchitis with Zpack and prednisone . Patient declined labs at this visit.  He was admitted 12/25 new acute systolic heart failure. Weight up 30 lbs. Echo showed EF 20-25%, RV low/normal. UDS + THC and cocaine. Cardiology consulted and he was diuresed. He ultimately left AMA before work up completed.  Saw Dr Clay 04/04/24 for post hospital follow up and agreeable to cath. Underwent R/LHC with Dr. Cherrie showing normal cors, markedly elevated biventricular pressures with R>L HF. He refused admission.  Today he presents to [facility] for post hospital follow up Overall feeling fine. Denies increasing SOB, palpitations, abnormal bleeding, CP, dizziness, edema, or PND/Orthopnea. Appetite ok. Weight at home 170 pounds. Taking all medications.   Social Hx: smokes THC, smokes 1.5 ppd, last cocaine use 01/2024, no ETOH, on diability Family Hx: father with CAD  Cardiac Studies - R/LHC 1/26: normal cors, RA 19, PA 52/39 (40), PCWP 26, CO/CI (Fick) 5/2.1, PVR 2.8 (Fick), PAPi 0.68 - Echo 12/25: EF 20-25%, RV low/normal, mild to moderate TR   Past Medical History:  Diagnosis Date   Blindness of left eye    CHF (congestive heart failure), NYHA class II, unspecified failure chronicity, combined (HCC) 03/04/2024   EF 20 to 25% global HK.  Presented to the hospital with acute combined CHF   Hidradenitis    Marijuana smoker 12/24/2022   Polysubstance abuse (HCC)    Marijuana and cocaine   Tobacco use disorder, moderate, dependence 02/16/2017    Current Outpatient Medications  Medication Sig Dispense Refill   albuterol  (VENTOLIN  HFA)  108 (90 Base) MCG/ACT inhaler Inhale 2 puffs into the lungs every 4 (four) hours as needed for wheezing or shortness of breath. (Patient not taking: Reported on 04/06/2024) 1 each 3   ascorbic acid (VITAMIN C) 1000 MG tablet Take 1,000 mg by mouth in the morning.     aspirin  EC 81 MG tablet Take 1 tablet (81 mg total) by mouth daily. Swallow whole.     cyanocobalamin (VITAMIN B12) 500 MCG tablet Take 1,000 mcg by mouth in the morning.     dapagliflozin  propanediol (FARXIGA ) 10 MG TABS tablet Take 1 tablet (10 mg total) by mouth daily before breakfast. 30 tablet 6   furosemide  (LASIX ) 80 MG tablet Take 1 tablet (80 mg total) by mouth daily. 90 tablet 3   losartan  (COZAAR ) 25 MG tablet Take 1 tablet (25 mg total) by mouth daily. 90 tablet 3   Magnesium 250 MG TABS Take 1 tablet by mouth daily.     Multiple Vitamins-Minerals (MENS 50+ MULTIVITAMIN) TABS Take 1 tablet by mouth in the morning.     potassium chloride  SA (KLOR-CON  M) 20 MEQ tablet Take 1 tablet (20 mEq total) by mouth daily. 60 tablet 1   predniSONE  (DELTASONE ) 20 MG tablet Take 1 tablet (20 mg total) by mouth daily with breakfast. (Patient not taking: Reported on 04/04/2024) 5 tablet 0   No current facility-administered medications for this visit.    Allergies[1]    Social History   Socioeconomic History   Marital status: Divorced    Spouse name: Not on file   Number of children:  1   Years of education: Not on file   Highest education level: Not on file  Occupational History   Occupation: Unemployed    Comment: Disability.  Back pain  Tobacco Use   Smoking status: Every Day    Current packs/day: 1.00    Types: Cigarettes   Smokeless tobacco: Never  Vaping Use   Vaping status: Never Used  Substance and Sexual Activity   Alcohol use: Not Currently    Comment: occ   Drug use: Yes    Types: Marijuana, Cocaine    Comment: daily   Sexual activity: Yes    Birth control/protection: Condom  Other Topics Concern   Not on  file  Social History Narrative   Lives home alone    Accompanied by his sister who helps provide history.      History of Polysubstance Abuse: Tobacco as well as THC and cocaine.  U tox is positive for THC and cocaine at the hospital.   Social Drivers of Health   Tobacco Use: High Risk (04/04/2024)   Patient History    Smoking Tobacco Use: Every Day    Smokeless Tobacco Use: Never    Passive Exposure: Not on file  Financial Resource Strain: Not on file  Food Insecurity: No Food Insecurity (03/03/2024)   Epic    Worried About Programme Researcher, Broadcasting/film/video in the Last Year: Never true    Ran Out of Food in the Last Year: Never true  Transportation Needs: No Transportation Needs (03/03/2024)   Epic    Lack of Transportation (Medical): No    Lack of Transportation (Non-Medical): No  Physical Activity: Not on file  Stress: Not on file  Social Connections: Not on file  Intimate Partner Violence: Not At Risk (03/03/2024)   Epic    Fear of Current or Ex-Partner: No    Emotionally Abused: No    Physically Abused: No    Sexually Abused: No  Depression (PHQ2-9): Low Risk (03/28/2024)   Depression (PHQ2-9)    PHQ-2 Score: 0  Alcohol Screen: Not on file  Housing: Low Risk (03/03/2024)   Epic    Unable to Pay for Housing in the Last Year: No    Number of Times Moved in the Last Year: 0    Homeless in the Last Year: No  Utilities: Not At Risk (03/03/2024)   Epic    Threatened with loss of utilities: No  Health Literacy: Not on file      Family History  Problem Relation Age of Onset   CAD Father    Colon cancer Neg Hx    Skin cancer Neg Hx     There were no vitals filed for this visit.  PHYSICAL EXAM: General:  NAD. No resp difficulty HEENT: Normal Neck: Supple. No JVD. Cor: Regular rate & rhythm. No rubs, gallops or murmurs. Lungs: Clear Abdomen: Soft, nontender, nondistended.  Extremities: No cyanosis, clubbing, rash, edema Neuro: Alert & oriented x 3, moves all 4 extremities  w/o difficulty. Affect pleasant.  ECG (personally reviewed):  ReDs reading: *** %, {Norm/abn:16337}  ASSESSMENT & PLAN: Chronic Systolic Heart Failure - new diagnosis 02/2024 - Echo 12/25: EF 20-25%, RV low/normal - Cath 1/26: normal cors, severe NICM, elevated biventricular pressures, CI 2.1 (Fick) - Etiology uncertain. HIV NR, TSH ok ? Substance abuse. Will arrange cMRI to evaluate for infiltrative vs inflammatory disease. - Arrange genetic testing - NYHA II, volume OK - Continue Lasix  80 mg daily + 20 KCL daily. - Continue  losartan  25 mg daily - Continue Farxiga  10 mg daily - Labs today  Polysubstance abuse - discussed cessation  3. COPD - Continue inhalers - Discussed smoking cessation - Referred to Pulm by PCP - add bisprolol   4. SDOH - Enroll in HF paramedicine program - Engage HFSW  Follow up in ***  [place], FNP-BC 04/17/24      [1] No Known Allergies  "

## 2024-04-17 NOTE — Telephone Encounter (Signed)
 CSW called pt to complete paramedicine initial assessment.  Pt requested call back at later time- when CSW attempted again no answer- left VM requesting return call  Andriette HILARIO Leech, LCSW Clinical Social Worker [facility] Desk#: [phone] Cell#: [phone]

## 2024-04-17 NOTE — Telephone Encounter (Signed)
 Called to confirm/remind patient of their appointment at the [facility] on 04/18/24.   Appointment:   [] Confirmed  [x] Left mess   [] No answer/No voice mail  [] VM Full/unable to leave message  [] Phone not in service  And to bring in all medications and/or complete list.

## 2024-04-18 ENCOUNTER — Telehealth (HOSPITAL_COMMUNITY): Payer: Self-pay

## 2024-04-18 ENCOUNTER — Ambulatory Visit (HOSPITAL_COMMUNITY)

## 2024-04-18 NOTE — Telephone Encounter (Signed)
 Reached out to pt to confirm he is going to clinic appoint today but he did not answer. I did LVM for him to return my call.   Izetta Quivers, EMT-Paramedic  [phone] 04/18/2024

## 2024-04-19 ENCOUNTER — Telehealth (HOSPITAL_COMMUNITY): Payer: Self-pay

## 2024-04-19 ENCOUNTER — Telehealth (HOSPITAL_COMMUNITY): Payer: Self-pay | Admitting: Licensed Clinical Social Worker

## 2024-04-19 NOTE — Telephone Encounter (Signed)
 CSW attempted to contact patient to complete SDOH screening for paramedicine program- unable to reach- left VM requesting return call  Danaye Sobh H. Sedale Jenifer, LCSW Clinical Social Worker [facility] Desk#: [phone] Cell#: [phone]

## 2024-04-20 ENCOUNTER — Telehealth: Payer: Self-pay

## 2024-04-20 ENCOUNTER — Other Ambulatory Visit: Payer: Self-pay | Admitting: Nurse Practitioner

## 2024-04-20 DIAGNOSIS — N281 Cyst of kidney, acquired: Secondary | ICD-10-CM

## 2024-04-20 NOTE — Telephone Encounter (Addendum)
 Attempted to reach out to pt again for home visit, called both numbers, no answer.  LVM. He was no show to his clinic appoint this week.   Will try again next week.   Izetta Quivers, EMT-Paramedic  [phone] 04/19/2024

## 2024-04-20 NOTE — Telephone Encounter (Signed)
 Copied from CRM #[id number]. Topic: Referral - Status >> Apr 20, 2024 11:10 AM Gustabo BIRCH wrote: James Wright with [facility]  - got a order for the pt to get a MRI and is needing a updated order. They called on 04/02/24 about this and still haven't heard back.  Call back - [phone] ext [phone]

## 2024-04-25 ENCOUNTER — Telehealth: Payer: Self-pay | Admitting: Cardiology

## 2024-04-25 ENCOUNTER — Ambulatory Visit: Payer: Self-pay | Admitting: Nurse Practitioner

## 2024-04-25 ENCOUNTER — Ambulatory Visit: Payer: Self-pay

## 2024-04-25 NOTE — Telephone Encounter (Signed)
 FYI Only or Action Required?: FYI only for provider: appointment scheduled on 3/3.  Patient was last seen in primary care on 03/28/2024 by Paseda, Folashade R, FNP.  Called Nurse Triage reporting Appointment.    Triage Disposition: See PCP Within 2 Weeks  Patient/caregiver understands and will follow disposition?    Message from Deaijah H sent at 04/25/2024  8:37 AM EST  Reason for Triage: Swelling in face and leg / Need to reschedule today's appointment   Reason for Disposition  Requesting regular office appointment  Answer Assessment - Initial Assessment Questions 1. REASON FOR CALL: What is the main reason for your call? or How can I best help you?   Spouse calling to reschedule appointment for today as she stated patient needs rest, and is not up to making it in. Appt. Rescheduled for 3/3 per request.  Protocols used: Information Only Call - No Triage-A-AH

## 2024-04-25 NOTE — Telephone Encounter (Signed)
 Pt c/o Shortness Of Breath: STAT if SOB developed within the last 24 hours or pt is noticeably SOB on the phone  1. Are you currently SOB (can you hear that pt is SOB on the phone)? No   2. How long have you been experiencing SOB? Since December  3. Are you SOB when sitting or when up moving around? Both, mainly moving around   4. Are you currently experiencing any other symptoms? No

## 2024-05-02 ENCOUNTER — Ambulatory Visit (HOSPITAL_COMMUNITY)

## 2024-05-22 ENCOUNTER — Ambulatory Visit: Admitting: Nurse Practitioner
